# Patient Record
Sex: Female | Born: 1970 | Race: Black or African American | Hispanic: No | Marital: Married | Smoking: Current every day smoker
Health system: Southern US, Community
[De-identification: ages and names within clinical notes are randomized; demographics above are authoritative.]

## PROBLEM LIST (undated history)

## (undated) DIAGNOSIS — K219 Gastro-esophageal reflux disease without esophagitis: Secondary | ICD-10-CM

## (undated) DIAGNOSIS — I10 Essential (primary) hypertension: Secondary | ICD-10-CM

## (undated) DIAGNOSIS — M069 Rheumatoid arthritis, unspecified: Secondary | ICD-10-CM

## (undated) DIAGNOSIS — Z8042 Family history of malignant neoplasm of prostate: Secondary | ICD-10-CM

## (undated) DIAGNOSIS — D649 Anemia, unspecified: Secondary | ICD-10-CM

## (undated) DIAGNOSIS — C801 Malignant (primary) neoplasm, unspecified: Secondary | ICD-10-CM

## (undated) DIAGNOSIS — N189 Chronic kidney disease, unspecified: Secondary | ICD-10-CM

## (undated) DIAGNOSIS — E119 Type 2 diabetes mellitus without complications: Secondary | ICD-10-CM

## (undated) DIAGNOSIS — M329 Systemic lupus erythematosus, unspecified: Secondary | ICD-10-CM

## (undated) DIAGNOSIS — R519 Headache, unspecified: Secondary | ICD-10-CM

## (undated) HISTORY — DX: Family history of malignant neoplasm of prostate: Z80.42

## (undated) HISTORY — PX: CHOLECYSTECTOMY: SHX55

## (undated) HISTORY — PX: TUBAL LIGATION: SHX77

---

## 2008-11-11 HISTORY — PX: FRACTURE SURGERY: SHX138

## 2019-06-21 ENCOUNTER — Ambulatory Visit: Payer: Self-pay | Admitting: Surgery

## 2019-06-21 NOTE — H&P (View-Only) (Signed)
History of Present Illness Sandra Patterson. Sandra Patterson; 06/21/2019 12:27 PM) The patient is a 48 year old female who presents with breast cancer. Referred by Sandra Patterson for right breast cancer  This is a 48 year old female who is perimenopausal who presents from [place] for new diagnosis of invasive lobular carcinoma of the right breast. The patient has been very good about keeping up with her annual mammograms. She had an abnormal finding in the retroareolar region on the right. She underwent further workup including ultrasound and core biopsy. Ultrasound showed an 8 mm hypoechoic area 9 o'clock position 1 cm from the nipple. Biopsy showed invasive lobular carcinoma. Ki-67 is 30%. ER and PR both positive. HER-2 is negative. The patient wishes to have her surgical and oncologic care performed in [place].  Menarche age 53 First pregnancy age 3 Breast-feed no Her months she was on oral contraceptives for at least 10 years Exline last menstrual. The patient is having abnormal irregular periods about every other month Family history negative for breast cancer   Problem List/Past Medical Sandra Patterson. Tiler Brandis, Patterson; 06/21/2019 12:27 PM) INVASIVE LOBULAR CARCINOMA OF BREAST IN FEMALE (C50.919)  Past Surgical History (Sandra Patterson, [employer]; 06/21/2019 11:30 AM) Gallbladder Surgery - Laparoscopic  Diagnostic Studies History (Sandra Patterson, [place]; 06/21/2019 11:30 AM) Colonoscopy 1-5 years ago Mammogram within last year  Allergies (Sandra Patterson, [place]; 06/21/2019 11:31 AM) Codeine Phosphate *ANALGESICS - OPIOID* Allergies Reconciled  Medication History (Sandra Patterson, [place]; 06/21/2019 11:32 AM) Atorvastatin Calcium (40MG Tablet, Oral) Active. Benazepril HCl (40MG Tablet, Oral) Active. Carvedilol (25MG Tablet, Oral) Active. Famotidine (20MG Tablet, Oral) Active. Vitamin D3 (1.25 MG(50000 UT) Capsule, Oral) Active. Hydroxychloroquine Sulfate (200MG Tablet, Oral)  Active. Iron (65MG Tablet, Oral) Active. Medications Reconciled  Social History (Sandra Patterson, [place]; 06/21/2019 11:30 AM) Alcohol use Occasional alcohol use. Caffeine use Carbonated beverages, Coffee. No drug use Tobacco use Current every day smoker.  Family History (Sandra Patterson, [place]; 06/21/2019 11:30 AM) Alcohol Abuse Father. Cancer Father, Mother. Heart disease in female family member before age 65 Hypertension Brother, Sister. Thyroid problems Sister.  Pregnancy / Birth History (Sandra Patterson, [place]; 06/21/2019 11:30 AM) Age at menarche 52 years. Contraceptive History Oral contraceptives. Gravida 3 Irregular periods Maternal age 76-20 Para 2  Other Problems Sandra Patterson. Sandra Harari, Patterson; 06/21/2019 12:27 PM) Arthritis Back Pain Cholelithiasis Gastric Ulcer Gastroesophageal Reflux Disease High blood pressure Hypercholesterolemia     Review of Systems (Sandra A. Brown RMA; 06/21/2019 11:30 AM) General Present- Night Sweats and Weight Gain. Not Present- Appetite Loss, Chills, Fatigue, Fever and Weight Loss. Skin Not Present- Change in Wart/Mole, Dryness, Hives, Jaundice, New Lesions, Non-Healing Wounds, Rash and Ulcer. HEENT Present- Wears glasses/contact lenses. Not Present- Earache, Hearing Loss, Hoarseness, Nose Bleed, Oral Ulcers, Ringing in the Ears, Seasonal Allergies, Sinus Pain, Sore Throat, Visual Disturbances and Yellow Eyes. Breast Present- Skin Changes. Not Present- Breast Mass, Breast Pain and Nipple Discharge.  Vitals (Sandra A. Brown RMA; 06/21/2019 11:31 AM) 06/21/2019 11:30 AM Weight: 297.4 lb Height: 69in Body Surface Area: 2.45 m Body Mass Index: 43.92 kg/m  Temp.: 97.31F  BP: 132/84 (Sitting, Left Arm, Standard)        Physical Exam Sandra Patterson; 06/21/2019 12:27 PM)  The physical exam findings are as follows: Note:WDWN in NAD Eyes: Pupils equal, round; sclera anicteric HENT: Oral mucosa moist;  good dentition Neck: No masses palpated, no thyromegaly Lungs: CTA bilaterally; normal respiratory effort Breasts: symmetric, large pendulous breasts with  bilateral fibrocystic changes; no dominant masses; no axillary lymphadenopathy CV: Regular rate and rhythm; no murmurs; extremities well-perfused with no edema Abd: +bowel sounds, soft, non-tender, no palpable organomegaly; no palpable hernias Skin: Warm, dry; no sign of jaundice Psychiatric - alert and oriented x 4; calm mood and affect    Assessment & Plan Sandra Key K. Sandra Patterson; 06/21/2019 12:29 PM)  INVASIVE LOBULAR CARCINOMA OF BREAST IN FEMALE (C50.919) Impression: Right breast; retroareolar; 8 mm  Current Plans Schedule for Surgery - Right radioactive seed localized lumpectomy/ sentinel lymph node biopsy. The surgical procedure has been discussed with the patient. Potential risks, benefits, alternative treatments, and expected outcomes have been explained. All of the patient's questions at this time have been answered. The likelihood of reaching the patient's treatment goal is good. The patient understand the proposed surgical procedure and wishes to proceed. Referred to Oncology, for evaluation and follow up (Oncology). Routine. Referred to Radiation Oncology, for evaluation and follow up (Radiation Oncology). Routine. Note:I spent approximately 30 minutes of this 40 minute appointment with the patient and her husband along with her sister who listened and by telephone. We discussed her surgical options for treatment including mastectomy versus breast conserving therapy. We will also discuss her possible postoperative course including radiation and hormone therapy. I answered all their questions. The patient has made a decision to proceed with breast conserving therapy. We will schedule a left lumpectomy under radioactive seed localization as well as lymph node biopsy.  Sandra Patterson. Sandra Dover, Patterson, [place] Tra[facility] Surgery Beeper [phone]  06/21/2019 12:29 PM

## 2019-06-21 NOTE — H&P (Signed)
History of Present Illness Sandra Patterson. Ima Hafner MD; 06/21/2019 12:27 PM) The patient is a 48 year old female who presents with breast cancer. Referred by Loma Newton FNP for right breast cancer  This is a 48 year old female who is perimenopausal who presents from [place] for new diagnosis of invasive lobular carcinoma of the right breast. The patient has been very good about keeping up with her annual mammograms. She had an abnormal finding in the retroareolar region on the right. She underwent further workup including ultrasound and core biopsy. Ultrasound showed an 8 mm hypoechoic area 9 o'clock position 1 cm from the nipple. Biopsy showed invasive lobular carcinoma. Ki-67 is 30%. ER and PR both positive. HER-2 is negative. The patient wishes to have her surgical and oncologic care performed in [place].  Menarche age 15 First pregnancy age 79 Breast-feed no Her months she was on oral contraceptives for at least 10 years Exline last menstrual. The patient is having abnormal irregular periods about every other month Family history negative for breast cancer   Problem List/Past Medical Sandra Patterson. Paradise Vensel, MD; 06/21/2019 12:27 PM) INVASIVE LOBULAR CARCINOMA OF BREAST IN FEMALE (C50.919)  Past Surgical History ([name], [place]; 06/21/2019 11:30 AM) Gallbladder Surgery - Laparoscopic  Diagnostic Studies History ([name], [place]; 06/21/2019 11:30 AM) Colonoscopy 1-5 years ago Mammogram within last year  Allergies ([name], [place]; 06/21/2019 11:31 AM) Codeine Phosphate *ANALGESICS - OPIOID* Allergies Reconciled  Medication History ([name], [place]; 06/21/2019 11:32 AM) Atorvastatin Calcium (40MG Tablet, Oral) Active. Benazepril HCl (40MG Tablet, Oral) Active. Carvedilol (25MG Tablet, Oral) Active. Famotidine (20MG Tablet, Oral) Active. Vitamin D3 (1.25 MG(50000 UT) Capsule, Oral) Active. Hydroxychloroquine Sulfate (200MG Tablet, Oral)  Active. Iron (65MG Tablet, Oral) Active. Medications Reconciled  Social History ([name], [place]; 06/21/2019 11:30 AM) Alcohol use Occasional alcohol use. Caffeine use Carbonated beverages, Coffee. No drug use Tobacco use Current every day smoker.  Family History ([name], [place]; 06/21/2019 11:30 AM) Alcohol Abuse Father. Cancer Father, Mother. Heart disease in female family member before age 67 Hypertension Brother, Sister. Thyroid problems Sister.  Pregnancy / Birth History ([name], [place]; 06/21/2019 11:30 AM) Age at menarche 40 years. Contraceptive History Oral contraceptives. Gravida 3 Irregular periods Maternal age 6-20 Para 2  Other Problems Sandra Patterson. Somya Jauregui, MD; 06/21/2019 12:27 PM) Arthritis Back Pain Cholelithiasis Gastric Ulcer Gastroesophageal Reflux Disease High blood pressure Hypercholesterolemia     Review of Systems ([name] RMA; 06/21/2019 11:30 AM) General Present- Night Sweats and Weight Gain. Not Present- Appetite Loss, Chills, Fatigue, Fever and Weight Loss. Skin Not Present- Change in Wart/Mole, Dryness, Hives, Jaundice, New Lesions, Non-Healing Wounds, Rash and Ulcer. HEENT Present- Wears glasses/contact lenses. Not Present- Earache, Hearing Loss, Hoarseness, Nose Bleed, Oral Ulcers, Ringing in the Ears, Seasonal Allergies, Sinus Pain, Sore Throat, Visual Disturbances and Yellow Eyes. Breast Present- Skin Changes. Not Present- Breast Mass, Breast Pain and Nipple Discharge.  Vitals ([name] RMA; 06/21/2019 11:31 AM) 06/21/2019 11:30 AM Weight: 297.4 lb Height: 69in Body Surface Area: 2.45 m Body Mass Index: 43.92 kg/m  Temp.: 97.4F  BP: 132/84 (Sitting, Left Arm, Standard)        Physical Exam Rodman Key K. Orva Riles MD; 06/21/2019 12:27 PM)  The physical exam findings are as follows: Note:WDWN in NAD Eyes: Pupils equal, round; sclera anicteric HENT: Oral mucosa moist;  good dentition Neck: No masses palpated, no thyromegaly Lungs: CTA bilaterally; normal respiratory effort Breasts: symmetric, large pendulous breasts with  bilateral fibrocystic changes; no dominant masses; no axillary lymphadenopathy CV: Regular rate and rhythm; no murmurs; extremities well-perfused with no edema Abd: +bowel sounds, soft, non-tender, no palpable organomegaly; no palpable hernias Skin: Warm, dry; no sign of jaundice Psychiatric - alert and oriented x 4; calm mood and affect    Assessment & Plan Rodman Key K. Rosemond Lyttle MD; 06/21/2019 12:29 PM)  INVASIVE LOBULAR CARCINOMA OF BREAST IN FEMALE (C50.919) Impression: Right breast; retroareolar; 8 mm  Current Plans Schedule for Surgery - Right radioactive seed localized lumpectomy/ sentinel lymph node biopsy. The surgical procedure has been discussed with the patient. Potential risks, benefits, alternative treatments, and expected outcomes have been explained. All of the patient's questions at this time have been answered. The likelihood of reaching the patient's treatment goal is good. The patient understand the proposed surgical procedure and wishes to proceed. Referred to Oncology, for evaluation and follow up (Oncology). Routine. Referred to Radiation Oncology, for evaluation and follow up (Radiation Oncology). Routine. Note:I spent approximately 30 minutes of this 40 minute appointment with the patient and her husband along with her sister who listened and by telephone. We discussed her surgical options for treatment including mastectomy versus breast conserving therapy. We will also discuss her possible postoperative course including radiation and hormone therapy. I answered all their questions. The patient has made a decision to proceed with breast conserving therapy. We will schedule a left lumpectomy under radioactive seed localization as well as lymph node biopsy.  Sandra Patterson. [name], MD, [place] Trauma Surgery Beeper [phone]  06/21/2019 12:29 PM

## 2019-06-22 NOTE — Progress Notes (Signed)
Location of Breast Cancer: Primary malignant neoplasm of right breast- Invasive lobular carcinoma right breast  Did patient present with symptoms (if so, please note symptoms) or was this found on screening mammography?: Routine Mammogram  Mammogram: abnormal finding in the retro-areolar region on the right.  Ultrasound: 8 mm hypoechoic area 9 o'clock position, 1 cm from the nipple.  Histology per Pathology Report: Right Breast 06/08/2019  Receptor Status: ER(+), PR (+), Her2-neu (-), Ki-67(30%)  Past/Anticipated interventions by surgeon, if any: Dr. [name] 06/21/2019 -Right radioactive seed localized lumpectomy -Referred to Oncology, for evaluation and follow up (Oncology). -Referred to Radiation Oncology, for evaluation and follow up (Radiation Oncology).   Past/Anticipated interventions by medical oncology, if any: Chemotherapy   Lymphedema issues, if any: No  Pain issues, if any:  No  SAFETY ISSUES:  Prior radiation? No  Pacemaker/ICD? No  Possible current pregnancy? Perimenopausal  Is the patient on methotrexate? No   Current Complaints / other details:   -Has constant pain in her ankle and stomach.     Cori Razor, RN 06/22/2019,3:56 PM

## 2019-06-23 ENCOUNTER — Ambulatory Visit
Admission: RE | Admit: 2019-06-23 | Discharge: 2019-06-23 | Disposition: A | Discharge status: Home / Self Care | Source: Ambulatory Visit | Attending: Radiation Oncology | Admitting: Radiation Oncology

## 2019-06-23 ENCOUNTER — Encounter: Payer: Self-pay | Admitting: Radiation Oncology

## 2019-06-23 ENCOUNTER — Ambulatory Visit: Payer: Self-pay | Admitting: Surgery

## 2019-06-23 ENCOUNTER — Other Ambulatory Visit: Payer: Self-pay

## 2019-06-23 ENCOUNTER — Encounter: Payer: Self-pay | Admitting: *Deleted

## 2019-06-23 ENCOUNTER — Other Ambulatory Visit: Payer: Self-pay | Admitting: Surgery

## 2019-06-23 VITALS — Ht 69.0 in | Wt 296.0 lb

## 2019-06-23 DIAGNOSIS — C50911 Malignant neoplasm of unspecified site of right female breast: Secondary | ICD-10-CM

## 2019-06-23 DIAGNOSIS — C50411 Malignant neoplasm of upper-outer quadrant of right female breast: Secondary | ICD-10-CM | POA: Insufficient documentation

## 2019-06-23 HISTORY — DX: Rheumatoid arthritis, unspecified: M06.9

## 2019-06-23 HISTORY — DX: Systemic lupus erythematosus, unspecified: M32.9

## 2019-06-23 NOTE — Progress Notes (Signed)
Radiation Oncology         [phone] ________________________________  Initial Outpatient Consultation - Conducted via telephone due to current COVID-19 concerns for limiting patient exposure  I spoke with the patient to conduct this consult visit via telephone to spare the patient unnecessary potential exposure in the healthcare setting during the current COVID-19 pandemic. The patient was notified in advance and was offered a [place] meeting to allow for face to face communication but unfortunately reported that they did not have the appropriate resources/technology to support such a visit and instead preferred to proceed with a telephone consult.  ________________________________  Name: Sandra Patterson        MRN: [id number]  Date of Service: 06/23/2019 DOB: 1971/07/26  CC:No primary care provider on file.  [name], MD     REFERRING PHYSICIAN: [name], MD   DIAGNOSIS: The encounter diagnosis was Primary malignant neoplasm of right breast [facility]).   HISTORY OF PRESENT ILLNESS: Sandra Patterson is a 48 y.o. female seen  for a new diagnosis of right breast cancer. The patient was noted to have a screening detected right breast mass when she went for routine mammography in [place], [place] where she resides. Diagnostic imaging is not available for review but per Dr. [name], she had an 8 mm lesion in the right breast at 9:00 and her axilla was clinically negative. 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 She met with Dr. [name] and wishes to proceed with treatment in [place]. She is in the process of getting set up for right lumpectomy with sentinel node biopsy and comes today to discuss the rationale for adjuvant radiotherapy.    PREVIOUS RADIATION THERAPY: No   PAST MEDICAL HISTORY:  Past Medical History:  Diagnosis Date   Lupus (systemic lupus erythematosus) ([place])    Rheumatoid  arthritis ([place])        PAST SURGICAL HISTORY:History reviewed. No pertinent surgical history.   FAMILY HISTORY:  Family History  Problem Relation Age of Onset   Lupus Mother    Throat cancer Father    Lupus Sister      SOCIAL HISTORY:  reports that she has been smoking. She has never used smokeless tobacco. She reports that she does not drink alcohol or use drugs. The patient is unemployed as a result of layoffs from [employer]. She has three adult children and lives in [place], [place].    ALLERGIES: Codeine   MEDICATIONS:  Current Outpatient Medications  Medication Sig Dispense Refill   atorvastatin (LIPITOR) 40 MG tablet Take 40 mg by mouth daily.     benazepril (LOTENSIN) 40 MG tablet Take 40 mg by mouth daily.     carvedilol (COREG) 25 MG tablet Take 25 mg by mouth 2 (two) times daily with a meal.     famotidine (PEPCID) 20 MG tablet Take 20 mg by mouth 2 (two) times daily.     ferrous sulfate 324 MG TBEC Take 324 mg by mouth.     hydroxychloroquine (PLAQUENIL) 200 MG tablet Take by mouth daily.     Vitamin D, Ergocalciferol, (DRISDOL) 1.25 MG (50000 UT) CAPS capsule Take 50,000 Units by mouth every 7 (seven) days.     No current facility-administered medications for this encounter.      REVIEW OF SYSTEMS: On review of systems, the patient reports that she is doing well overall. She denies any chest pain, shortness of breath,  cough, fevers, chills, night sweats, unintended weight changes. She denies any bowel or bladder disturbances, and denies abdominal pain, nausea or vomiting. She denies any new musculoskeletal or joint aches or pains. A complete review of systems is obtained and is otherwise negative.     PHYSICAL EXAM:  Wt Readings from Last 3 Encounters:  06/23/19 296 lb (134.3 kg)   Unable to assess due to nature of encounter   ECOG = 0  0 - Asymptomatic (Fully active, able to carry on all predisease activities without restriction)  1 - Symptomatic but  completely ambulatory (Restricted in physically strenuous activity but ambulatory and able to carry out work of a light or sedentary nature. For example, light housework, office work)  2 - Symptomatic, <50% in bed during the day (Ambulatory and capable of all self care but unable to carry out any work activities. Up and about more than 50% of waking hours)  3 - Symptomatic, >50% in bed, but not bedbound (Capable of only limited self-care, confined to bed or chair 50% or more of waking hours)  4 - Bedbound (Completely disabled. Cannot carry on any self-care. Totally confined to bed or chair)  5 - Death   Eustace Pen MM, Creech RH, Tormey DC, et al. [phone]). "Toxicity and response criteria of the [facility]". [place] Oncol. 5 (6): 649-55    LABORATORY DATA:  No results found for: WBC, HGB, HCT, MCV, PLT No results found for: NA, K, CL, CO2 No results found for: ALT, AST, GGT, ALKPHOS, BILITOT    RADIOGRAPHY: No results found.     IMPRESSION/PLAN: 1. Probable Stage IA, [id number] grade x, ER/PR positive invasive lobular carcinoma of the right breast. Dr. Lisbeth Renshaw discusses the pathology findings and reviews the nature of right breast disease. The consensus from the breast conference includes breast conservation with lumpectomy with  sentinel node biopsy. Depending on the size of the final tumor measurements rendered by pathology, the tumor may be tested for Oncotype Dx score to determine a role for systemic therapy. Provided that chemotherapy is not indicated, the patient's course would then be followed by external radiotherapy to the breast followed by antiestrogen therapy. We discussed the risks, benefits, short, and long term effects of radiotherapy, and the patient is interested in proceeding. Dr. Lisbeth Renshaw discusses the delivery and logistics of radiotherapy and anticipates a course of 6 1/2 weeks of radiotherapy. We will see her back about 2 weeks after surgery to discuss the  simulation process and anticipate we starting radiotherapy about 4-6 weeks after surgery.  2. Possible genetic predisposition to malignancy. The patient is a candidate for genetic testing given her personal and family history. She was offered referral and is interested but states results of this would not influence her surgical decision making. 3. Rheumatologic conditions. The patient has mild lupus per report and takes Plaquenil for RA. She continues to follow with Dr. Scarlette Shorts in [place]. We will follow her closely along during treatment and she is aware that these conditions can flare in the midst of radiotherapy but would not be prohibitive to consider radiotherapy in her situation.   Given current concerns for patient exposure during the COVID-19 pandemic, this encounter was conducted via telephone.  The patient has given verbal consent for this type of encounter. The time spent during this encounter was 45 minutes and 50% of that time was spent in the coordination of her care. The attendants for this meeting included Dr. Lisbeth Renshaw, Blenda Nicely, RN, Bryson Ha  Dara Lords, PAC and Jordan Likes  During the encounter, Dr. Lisbeth Renshaw, Blenda Nicely, RN, and Shona Simpson [facility] were located at [facility] Radiation Oncology Department.  Sandra Patterson  was located at home.   The above documentation reflects my direct findings during this shared patient visit. Please see the separate note by Dr. Lisbeth Renshaw on this date for the remainder of the patient's plan of care.    Carola Rhine, PAC

## 2019-06-24 ENCOUNTER — Telehealth: Payer: Self-pay | Admitting: Hematology and Oncology

## 2019-06-24 ENCOUNTER — Other Ambulatory Visit: Payer: Self-pay | Admitting: Surgery

## 2019-06-24 DIAGNOSIS — C50911 Malignant neoplasm of unspecified site of right female breast: Secondary | ICD-10-CM

## 2019-06-24 NOTE — Telephone Encounter (Signed)
Received a new patient referral from Dr. [name] for a dx of breast cancer. Ms. Sandra Patterson has been cld and scheduled to see Dr. Lindi Adie on 8/14 at 1pm. She's been made aware to arrive 20 minutes early.

## 2019-06-25 ENCOUNTER — Inpatient Hospital Stay: Payer: Self-pay | Admitting: Hematology and Oncology

## 2019-06-25 ENCOUNTER — Encounter: Payer: Self-pay | Admitting: *Deleted

## 2019-06-25 ENCOUNTER — Other Ambulatory Visit: Payer: Self-pay

## 2019-06-25 ENCOUNTER — Inpatient Hospital Stay: Attending: Oncology | Admitting: Hematology and Oncology

## 2019-06-25 DIAGNOSIS — Z170 Estrogen receptor positive status [ER+]: Secondary | ICD-10-CM | POA: Insufficient documentation

## 2019-06-25 DIAGNOSIS — E785 Hyperlipidemia, unspecified: Secondary | ICD-10-CM | POA: Insufficient documentation

## 2019-06-25 DIAGNOSIS — F17210 Nicotine dependence, cigarettes, uncomplicated: Secondary | ICD-10-CM | POA: Diagnosis not present

## 2019-06-25 DIAGNOSIS — M069 Rheumatoid arthritis, unspecified: Secondary | ICD-10-CM | POA: Insufficient documentation

## 2019-06-25 DIAGNOSIS — K219 Gastro-esophageal reflux disease without esophagitis: Secondary | ICD-10-CM | POA: Insufficient documentation

## 2019-06-25 DIAGNOSIS — C50911 Malignant neoplasm of unspecified site of right female breast: Secondary | ICD-10-CM

## 2019-06-25 DIAGNOSIS — Z79899 Other long term (current) drug therapy: Secondary | ICD-10-CM | POA: Insufficient documentation

## 2019-06-25 DIAGNOSIS — M329 Systemic lupus erythematosus, unspecified: Secondary | ICD-10-CM | POA: Insufficient documentation

## 2019-06-25 DIAGNOSIS — C50411 Malignant neoplasm of upper-outer quadrant of right female breast: Secondary | ICD-10-CM | POA: Diagnosis present

## 2019-06-25 DIAGNOSIS — I10 Essential (primary) hypertension: Secondary | ICD-10-CM | POA: Insufficient documentation

## 2019-06-25 NOTE — Progress Notes (Signed)
She she had her [name] NOTE  Patient Care Team: System, Pcp Not In as PCP - General  CHIEF COMPLAINTS/PURPOSE OF CONSULTATION:  Newly diagnosed breast cancer  HISTORY OF PRESENTING ILLNESS:  Sandra Patterson 48 y.o. female is here because of recent diagnosis of right breast cancer.  Patient had a routine screening mammogram that detected a 0.8 cm mass in the right breast position 1 cm from the nipple.  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  This was detected at [facility] in [place].  She was seen by surgery and was recommended to see medical oncology for multidisciplinary care.  I reviewed her records extensively and collaborated the history with the patient.  SUMMARY OF ONCOLOGIC HISTORY: Oncology History  Primary malignant neoplasm of right breast ([place])  06/23/2019 Initial Diagnosis   Routine screening mammogram detected an 0.8cm mass in the right breast at the 9:00 position 1 cm from the nipple. 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    06/23/2019 Cancer Staging   Staging form: Breast, AJCC 8th Edition - Clinical stage from 06/23/2019: cT1b, cN0, cM0, GX, ER+, PR+, HER2- - Signed by [name], MD on 06/25/2019      MEDICAL HISTORY:  Past Medical History:  Diagnosis Date  . Lupus (systemic lupus erythematosus) ([place])   . Rheumatoid arthritis ([place])     SURGICAL HISTORY: No past surgical history on file.  SOCIAL HISTORY: Social History   Socioeconomic History  . Marital status: Married    Spouse name: Not on file  . Number of children: Not on file  . Years of education: Not on file  . Highest education level: Not on file  Occupational History  . Not on file  Social Needs  . Financial resource strain: Not on file  . Food insecurity    Worry: Not on file    Inability: Not on file  . Transportation needs    Medical: No     Non-medical: No  Tobacco Use  . Smoking status: Current Every Day Smoker  . Smokeless tobacco: Never Used  Substance and Sexual Activity  . Alcohol use: Never    Frequency: Never  . Drug use: Never  . Sexual activity: Yes    Birth control/protection: None  Lifestyle  . Physical activity    Days per week: Not on file    Minutes per session: Not on file  . Stress: Not on file  Relationships  . Social Herbalist on phone: Not on file    Gets together: Not on file    Attends religious service: Not on file    Active member of club or organization: Not on file    Attends meetings of clubs or organizations: Not on file    Relationship status: Not on file  . Intimate partner violence    Fear of current or ex partner: Not on file    Emotionally abused: Not on file    Physically abused: Not on file    Forced sexual activity: Not on file  Other Topics Concern  . Not on file  Social History Narrative  . Not on file    FAMILY HISTORY: Family History  Problem Relation Age of Onset  . Lupus Mother   . Throat cancer Father   . Lupus Sister     ALLERGIES:  is allergic to codeine.  MEDICATIONS:  Current Outpatient Medications  Medication Sig Dispense Refill  . amLODipine (NORVASC) 5 MG tablet Take 5 mg by mouth daily.    Marland Kitchen atorvastatin (LIPITOR) 40 MG tablet Take 40 mg by mouth daily.    . benazepril (LOTENSIN) 40 MG tablet Take 40 mg by mouth 2 (two) times daily.     . carvedilol (COREG) 25 MG tablet Take 25 mg by mouth 2 (two) times daily with a meal.    . famotidine (PEPCID) 20 MG tablet Take 20 mg by mouth daily.     . ferrous sulfate 325 (65 FE) MG tablet Take 325 mg by mouth 2 (two) times daily.     . hydroxychloroquine (PLAQUENIL) 200 MG tablet Take 200 mg by mouth 2 (two) times daily.     . Vitamin D, Ergocalciferol, (DRISDOL) 1.25 MG (50000 UT) CAPS capsule Take 50,000 Units by mouth every Friday.      No current facility-administered medications for this  visit.     REVIEW OF SYSTEMS:   Constitutional: Denies fevers, chills or abnormal night sweats Eyes: Denies blurriness of vision, double vision or watery eyes Ears, nose, mouth, throat, and face: Denies mucositis or sore throat Respiratory: Denies cough, dyspnea or wheezes Cardiovascular: Denies palpitation, chest discomfort or lower extremity swelling Gastrointestinal:  Denies nausea, heartburn or change in bowel habits Skin: Denies abnormal skin rashes Lymphatics: Denies new lymphadenopathy or easy bruising Neurological:Denies numbness, tingling or new weaknesses Behavioral/Psych: Mood is stable, no new changes  Breast:  Denies any palpable lumps or discharge All other systems were reviewed with the patient and are negative.  PHYSICAL EXAMINATION: ECOG PERFORMANCE STATUS: 1 - Symptomatic but completely ambulatory  Vitals:   06/25/19 1328  BP: (!) 160/92  Pulse: 79  Resp: 18  Temp: 99.6 F (37.6 C)  SpO2: 97%   Filed Weights   06/25/19 1328  Weight: 294 lb 14.4 oz (133.8 kg)    GENERAL:alert, no distress and comfortable SKIN: skin color, texture, turgor are normal, no rashes or significant lesions EYES: normal, conjunctiva are pink and non-injected, sclera clear OROPHARYNX:no exudate, no erythema and lips, buccal mucosa, and tongue normal  NECK: supple, thyroid normal size, non-tender, without nodularity LYMPH:  no palpable lymphadenopathy in the cervical, axillary or inguinal LUNGS: clear to auscultation and percussion with normal breathing effort HEART: regular rate & rhythm and no murmurs and no lower extremity edema ABDOMEN:abdomen soft, non-tender and normal bowel sounds Musculoskeletal:no cyanosis of digits and no clubbing  PSYCH: alert & oriented x 3 with fluent speech NEURO: no focal motor/sensory deficits BREAST: No palpable nodules in breast. No palpable axillary or supraclavicular lymphadenopathy (exam performed in the presence of a chaperone)    RADIOGRAPHIC STUDIES: I have personally reviewed the radiological reports and agreed with the findings in the report.  ASSESSMENT AND PLAN:  Primary malignant neoplasm of right breast ([place]) 06/23/2019:Routine screening mammogram detected an 0.8cm mass in the right breast at the 9:00 position 1 cm from the nipple. 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  T1CN0 stage Ia clinical stage History of rheumatoid arthritis and lupus and hypertension  Pathology and radiology counseling:Discussed with the patient, the details of pathology including the type of breast cancer,the clinical staging, the significance of ER, PR and HER-2/neu receptors and the implications for treatment. After reviewing the pathology in detail, we proceeded to discuss the different treatment options between surgery, radiation, chemotherapy, antiestrogen therapies.  Recommendations: 1. Breast conserving surgery followed by 2.  Oncotype DX testing to determine if chemotherapy would be of any benefit followed by 3. Adjuvant radiation therapy followed by 4. Adjuvant antiestrogen therapy  Oncotype counseling: I discussed Oncotype DX test. I explained to the patient that this is a 21 gene panel to evaluate patient tumors DNA to calculate recurrence score. This would help determine whether patient has high risk or intermediate risk or low risk breast cancer. She understands that if her tumor was found to be high risk, she would benefit from systemic chemotherapy. If low risk, no need of chemotherapy. If she was found to be intermediate risk, we would need to evaluate the score as well as other risk factors and determine if an abbreviated chemotherapy may be of benefit.  Return to clinic after surgery to discuss final pathology report and then determine if Oncotype DX testing will need to be sent.     All questions were answered. The patient knows to call the clinic with any problems, questions  or concerns.    Harriette Ohara, MD 06/25/19

## 2019-06-25 NOTE — Assessment & Plan Note (Signed)
06/23/2019:Routine screening mammogram detected an 0.8cm mass in the right breast at the 9:00 position 1 cm from the nipple. 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  T1CN0 stage Ia clinical stage  Pathology and radiology counseling:Discussed with the patient, the details of pathology including the type of breast cancer,the clinical staging, the significance of ER, PR and HER-2/neu receptors and the implications for treatment. After reviewing the pathology in detail, we proceeded to discuss the different treatment options between surgery, radiation, chemotherapy, antiestrogen therapies.  Recommendations: 1. Breast conserving surgery followed by 2. Oncotype DX testing to determine if chemotherapy would be of any benefit followed by 3. Adjuvant radiation therapy followed by 4. Adjuvant antiestrogen therapy  Oncotype counseling: I discussed Oncotype DX test. I explained to the patient that this is a 21 gene panel to evaluate patient tumors DNA to calculate recurrence score. This would help determine whether patient has high risk or intermediate risk or low risk breast cancer. She understands that if her tumor was found to be high risk, she would benefit from systemic chemotherapy. If low risk, no need of chemotherapy. If she was found to be intermediate risk, we would need to evaluate the score as well as other risk factors and determine if an abbreviated chemotherapy may be of benefit.  Return to clinic after surgery to discuss final pathology report and then determine if Oncotype DX testing will need to be sent.

## 2019-06-28 ENCOUNTER — Other Ambulatory Visit: Payer: Self-pay | Admitting: Surgery

## 2019-06-28 ENCOUNTER — Telehealth: Payer: Self-pay | Admitting: Hematology and Oncology

## 2019-06-28 NOTE — Telephone Encounter (Signed)
I left a message regarding schedule  

## 2019-06-28 NOTE — Pre-Procedure Instructions (Signed)
Sandra Patterson  06/28/2019      [facility] [address], [place] [address] Phone: [phone] Fax: [phone]    Your procedure is scheduled on July 02, 2019.  Report to [facility] Admitting at 530 AM.  Call this number if you have problems the morning of surgery:  [phone]   Remember:  Do not eat after midnight.  You may drink clear liquids until 430 AM.  Clear liquids allowed are:                    Water, Juice (non-citric and without pulp), Clear Tea, Black Coffee only and Gatorade    Take these medicines the morning of surgery with A SIP OF WATER  Amlodipine (Norvasc) Carvedilol (Coreg) Famotidine (Pepcid)  Beginning now, STOP taking any Aspirin (unless otherwise instructed by your surgeon), Aleve, Naproxen, Ibuprofen, Motrin, Advil, Goody's, BC's, all herbal medications, fish oil, and all vitamins    Day of surgery:  Do not wear jewelry, make-up or nail polish.  Do not wear lotions, powders, or perfumes, or deodorant.  Do not shave 48 hours prior to surgery.    Do not bring valuables to the hospital.  [facility] is not responsible for any belongings or valuables.  Contacts, dentures or bridgework may not be worn into surgery.  Leave your suitcase in the car.  After surgery it may be brought to your room.  For patients admitted to the hospital, discharge time will be determined by your treatment team.  Patients discharged the day of surgery will not be allowed to drive home.    [facility]- Preparing For Surgery  Before surgery, you can play an important role. Because skin is not sterile, your skin needs to be as free of germs as possible. You can reduce the number of germs on your skin by washing with CHG (chlorahexidine gluconate) Soap before surgery.  CHG is an antiseptic cleaner which kills germs and bonds with the skin to continue killing germs even after washing.    Oral Hygiene is  also important to reduce your risk of infection.  Remember - BRUSH YOUR TEETH THE MORNING OF SURGERY WITH YOUR REGULAR TOOTHPASTE  Please do not use if you have an allergy to CHG or antibacterial soaps. If your skin becomes reddened/irritated stop using the CHG.  Do not shave (including legs and underarms) for at least 48 hours prior to first CHG shower. It is OK to shave your face.  Please follow these instructions carefully.   1. Shower the NIGHT BEFORE SURGERY and the MORNING OF SURGERY with CHG.   2. If you chose to wash your hair, wash your hair first as usual with your normal shampoo.  3. After you shampoo, rinse your hair and body thoroughly to remove the shampoo.  4. Use CHG as you would any other liquid soap. You can apply CHG directly to the skin and wash gently with a scrungie or a clean washcloth.   5. Apply the CHG Soap to your body ONLY FROM THE NECK DOWN.  Do not use on open wounds or open sores. Avoid contact with your eyes, ears, mouth and genitals (private parts). Wash Face and genitals (private parts)  with your normal soap.  6. Wash thoroughly, paying special attention to the area where your surgery will be performed.  7. Thoroughly rinse your body with warm water from the neck down.  8. DO NOT shower/wash with your normal soap after using and rinsing off the CHG Soap.  9. Pat yourself dry with a CLEAN TOWEL.  10. Wear CLEAN PAJAMAS to bed the night before surgery, wear comfortable clothes the morning of surgery  11. Place CLEAN SHEETS on your bed the night of your first shower and DO NOT SLEEP WITH PETS.  Day of Surgery: Shower as above Do not apply any deodorants/lotions.  Please wear clean clothes to the hospital/surgery center.   Remember to brush your teeth WITH YOUR REGULAR TOOTHPASTE.   Please read over the following fact sheets that you were given.

## 2019-06-29 ENCOUNTER — Other Ambulatory Visit (HOSPITAL_COMMUNITY)
Admission: RE | Admit: 2019-06-29 | Discharge: 2019-06-29 | Disposition: A | Discharge status: Home / Self Care | Source: Ambulatory Visit | Attending: Surgery | Admitting: Surgery

## 2019-06-29 ENCOUNTER — Encounter (HOSPITAL_COMMUNITY)
Admission: RE | Admit: 2019-06-29 | Discharge: 2019-06-29 | Disposition: A | Discharge status: Home / Self Care | Source: Ambulatory Visit | Attending: Surgery | Admitting: Surgery

## 2019-06-29 ENCOUNTER — Other Ambulatory Visit (HOSPITAL_COMMUNITY): Payer: Self-pay

## 2019-06-29 ENCOUNTER — Encounter (HOSPITAL_COMMUNITY): Payer: Self-pay

## 2019-06-29 ENCOUNTER — Other Ambulatory Visit: Payer: Self-pay

## 2019-06-29 ENCOUNTER — Telehealth: Payer: Self-pay | Admitting: Hematology and Oncology

## 2019-06-29 DIAGNOSIS — Z9049 Acquired absence of other specified parts of digestive tract: Secondary | ICD-10-CM | POA: Insufficient documentation

## 2019-06-29 DIAGNOSIS — M329 Systemic lupus erythematosus, unspecified: Secondary | ICD-10-CM | POA: Diagnosis not present

## 2019-06-29 DIAGNOSIS — Z6841 Body Mass Index (BMI) 40.0 and over, adult: Secondary | ICD-10-CM | POA: Diagnosis not present

## 2019-06-29 DIAGNOSIS — C7981 Secondary malignant neoplasm of breast: Secondary | ICD-10-CM | POA: Insufficient documentation

## 2019-06-29 DIAGNOSIS — R9431 Abnormal electrocardiogram [ECG] [EKG]: Secondary | ICD-10-CM | POA: Insufficient documentation

## 2019-06-29 DIAGNOSIS — M069 Rheumatoid arthritis, unspecified: Secondary | ICD-10-CM | POA: Insufficient documentation

## 2019-06-29 DIAGNOSIS — N189 Chronic kidney disease, unspecified: Secondary | ICD-10-CM | POA: Diagnosis not present

## 2019-06-29 DIAGNOSIS — Z79899 Other long term (current) drug therapy: Secondary | ICD-10-CM | POA: Diagnosis not present

## 2019-06-29 DIAGNOSIS — I129 Hypertensive chronic kidney disease with stage 1 through stage 4 chronic kidney disease, or unspecified chronic kidney disease: Secondary | ICD-10-CM | POA: Insufficient documentation

## 2019-06-29 DIAGNOSIS — Z87891 Personal history of nicotine dependence: Secondary | ICD-10-CM | POA: Insufficient documentation

## 2019-06-29 DIAGNOSIS — D649 Anemia, unspecified: Secondary | ICD-10-CM | POA: Insufficient documentation

## 2019-06-29 DIAGNOSIS — Z01818 Encounter for other preprocedural examination: Secondary | ICD-10-CM | POA: Insufficient documentation

## 2019-06-29 DIAGNOSIS — Z20828 Contact with and (suspected) exposure to other viral communicable diseases: Secondary | ICD-10-CM | POA: Diagnosis not present

## 2019-06-29 DIAGNOSIS — E669 Obesity, unspecified: Secondary | ICD-10-CM | POA: Insufficient documentation

## 2019-06-29 DIAGNOSIS — C50911 Malignant neoplasm of unspecified site of right female breast: Secondary | ICD-10-CM | POA: Insufficient documentation

## 2019-06-29 HISTORY — DX: Anemia, unspecified: D64.9

## 2019-06-29 HISTORY — DX: Type 2 diabetes mellitus without complications: E11.9

## 2019-06-29 HISTORY — DX: Gastro-esophageal reflux disease without esophagitis: K21.9

## 2019-06-29 HISTORY — DX: Chronic kidney disease, unspecified: N18.9

## 2019-06-29 HISTORY — DX: Malignant (primary) neoplasm, unspecified: C80.1

## 2019-06-29 HISTORY — DX: Headache, unspecified: R51.9

## 2019-06-29 HISTORY — DX: Essential (primary) hypertension: I10

## 2019-06-29 LAB — CBC
HCT: 47.5 % — ABNORMAL HIGH (ref 36.0–46.0)
Hemoglobin: 15.5 g/dL — ABNORMAL HIGH (ref 12.0–15.0)
MCH: 31.2 pg (ref 26.0–34.0)
MCHC: 32.6 g/dL (ref 30.0–36.0)
MCV: 95.6 fL (ref 80.0–100.0)
Platelets: 175 10*3/uL (ref 150–400)
RBC: 4.97 MIL/uL (ref 3.87–5.11)
RDW: 12.9 % (ref 11.5–15.5)
WBC: 7.2 10*3/uL (ref 4.0–10.5)
nRBC: 0 % (ref 0.0–0.2)

## 2019-06-29 LAB — BASIC METABOLIC PANEL
Anion gap: 10 (ref 5–15)
BUN: 9 mg/dL (ref 6–20)
CO2: 24 mmol/L (ref 22–32)
Calcium: 9.8 mg/dL (ref 8.9–10.3)
Chloride: 101 mmol/L (ref 98–111)
Creatinine, Ser: 1.53 mg/dL — ABNORMAL HIGH (ref 0.44–1.00)
GFR calc Af Amer: 47 mL/min — ABNORMAL LOW (ref >60)
GFR calc non Af Amer: 40 mL/min — ABNORMAL LOW (ref >60)
Glucose, Bld: 124 mg/dL — ABNORMAL HIGH (ref 70–99)
Potassium: 3.4 mmol/L — ABNORMAL LOW (ref 3.5–5.1)
Sodium: 135 mmol/L (ref 135–145)

## 2019-06-29 LAB — SARS CORONAVIRUS 2 (TAT 6-24 HRS): SARS Coronavirus 2: NEGATIVE

## 2019-06-29 LAB — POCT PREGNANCY, URINE: Preg Test, Ur: NEGATIVE

## 2019-06-29 NOTE — Telephone Encounter (Signed)
Scheduled appt per 8/18 sch message - unable tot reach pt . Left message with appt date and time

## 2019-06-29 NOTE — Progress Notes (Signed)
Call to Dr. Lona Millard office, spoke with Baxter Hire, & she reports pt. Didn't come in for her appt. Today. The pt. told Dr. Lona Millard office that she was stuck in [place] for the day & couldn't make the appt. Appt. Has been rescheduled for Sept. 6.

## 2019-06-29 NOTE — Progress Notes (Addendum)
Anesthesia Chart Review:   Case: [id number] Date/Time: 07/02/19 0715   Procedure: RIGHT BREAST LUMPECTOMY WITH RADIOACTIVE SEED AND SENTINEL LYMPH NODE BIOPSY (Right Breast)   Anesthesia type: General   Pre-op diagnosis: RIGHT INVASIVCE LOBULAR CARCINOMA   Location: [facility] OR   Surgeon: [name], MD    RSL is scheduled for 07/01/19 at 2:15 PM  DISCUSSION: Patient is a 48 year old female scheduled for the above procedure.  History includes smoking, SLE, RA, HTN, GERD, anemia, CKD, right breast cancer. BMI is consistent with obesity.   Patient had a PAT RN visit at 8:00 AM 06/29/19. She reported having a cardiology appointment with Raechel Chute, MD with [facility] later that same day, but when records requested, patient had rescheduled the appointment for 07/18/19 because she didn't feel she could make the appointment in time driving from [place]. I called and spoke with patient to inquire about reason for referral, but she was not sure. She denied chest pain, SOB, palpitations, edema, dizziness, syncope. She does not routinely exercise, but can do day-to-day activities and walk up a flight of stairs without CV symptoms. She thinks she could walk up two flights of stairs if needed. She does not believe that she had prior cardiac testing other than EKG. She reports home BP readings 140-150's/80-90's.  Her 06/29/19 EKG showed NSR, possible LAE, inferior infarct (age undetermined), T wave abnormality (consider ischemia). Currently, no comparison tracing. Given that she rescheduled her cardiology appointment and patient unclear about why she was referred, I called and spoke with her PCP Roselie Skinner, [place] and discussed EKG and labs findings. She reported that patient last seen earlier this month. BP was poorly controlled (agent added) and had elevated blood glucose. Given her multiple risk factors, she was referred to cardiology and felt this should occur prior to  surgery. She will have her staff contact patient and cardiology to see if they can reschedule this week. By PCP verbal account, patient's last Cr earlier this month was ~ 1.22 with glucose 120's and SBP ~ 150-170, DBP 80's-102. Another BP agent added, and patient to have BP recheck and A1c later this week.   I notified Kennyth Lose at Dr. Vonna Kotyk office. Timing of surgery and RSL will depend on when patient is able to be worked back in to see cardiologist. Of note, BP 184/86 at PAT, but patient says she did not take her BP meds prior to her appointment due to leaving her house so early.    Urine pregnancy test negative. COVID-19 test in process. Chart will be left for follow-up.   ADDENDUM 07/01/19 9:33 AM:  Patient had BP recheck and A1c at [facility] on 06/29/19. BP 148/70. Reported A1c 6.6% pushing her just into the DM2 category (will add to history). PCP Roselie Skinner, FNP was able to get patient an appointment with cardiologist Raechel Chute, MD on 06/30/19. He is ordering a future echo, but did not feel it had to be done prior to surgery. He felt she was "low cardiac risk for upcoming lumpectomy and radiation therapy. She can proceed with no additional cardiac testing prior to procedures." I notified [facility] triage nursing staff. COVID test negative.     VS: BP (!) 184/86 Comment: pt states that she hasnt taken bp meds this am/notified Beth RN  Pulse 84   Temp (!) 36.3 C   Resp 18   Ht 5\' 9"  (1.753 m)   Wt 133 kg  LMP 05/18/2019 (Approximate)   SpO2 98%   BMI 43.30 kg/m    PROVIDERS: Roselie Skinner, FNP is PCP  PATHS: [place], [place] Phone [phone] Fax [phone]  - Ronnette Hila, MD is rheumatologist  - She has upcoming cardiology visit at [place].  - She has upcoming nephrology visit at [facility] with Orene Desanctis, DNP.  - GI is with [facility].    LABS: Preoperative labs noted. Cr 1.53, known CKD  with upcoming nephrology visit. Per PCP, previous Cr ~1.22. Glucose 124. H/H 15.5/47.5. Urine pregnancy test negative. (all labs ordered are listed, but only abnormal results are displayed)  Labs Reviewed  BASIC METABOLIC PANEL - Abnormal; Notable for the following components:      Result Value   Potassium 3.4 (*)    Glucose, Bld 124 (*)    Creatinine, Ser 1.53 (*)    GFR calc non Af Amer 40 (*)    GFR calc Af Amer 47 (*)    All other components within normal limits  CBC - Abnormal; Notable for the following components:   Hemoglobin 15.5 (*)    HCT 47.5 (*)    All other components within normal limits  POCT PREGNANCY, URINE     EKG: 06/29/19: Normal sinus rhythm Possible Left atrial enlargement Inferior infarct , age undetermined T wave abnormality, consider lateral ischemia Abnormal ECG Confirmed by Ida Rogue [id number]) on 06/29/2019 3:15:49 PM   CV: N/A   Past Medical History:  Diagnosis Date  . Anemia   . Cancer ([place])    Right breast  . GERD (gastroesophageal reflux disease)   . Headache    pt. relates this to stress somewhat   . Hypertension   . Lupus (systemic lupus erythematosus) ([place])   . Rheumatoid arthritis ([facility])    RA, followed by Dr. Scarlette Shorts in [place] for Lupus & RA    Past Surgical History:  Procedure Laterality Date  . CHOLECYSTECTOMY    . FRACTURE SURGERY Left 2010   pin removed from hand   . TUBAL LIGATION    . VAGINAL DELIVERY     x2    MEDICATIONS: . acetaminophen (TYLENOL) 500 MG tablet  . amLODipine (NORVASC) 5 MG tablet  . atorvastatin (LIPITOR) 40 MG tablet  . benazepril (LOTENSIN) 40 MG tablet  . carvedilol (COREG) 25 MG tablet  . famotidine (PEPCID) 20 MG tablet  . ferrous sulfate 325 (65 FE) MG tablet  . hydroxychloroquine (PLAQUENIL) 200 MG tablet  . Vitamin D, Ergocalciferol, (DRISDOL) 1.25 MG (50000 UT) CAPS capsule   No current facility-administered medications for this encounter.     Myra Gianotti, PA-C Surgical  Short Stay/Anesthesiology [facility] Phone [phone] [facility] Phone [phone] 06/29/2019 5:55 PM

## 2019-06-29 NOTE — Progress Notes (Addendum)
Pt. Seeing cardiology today- [facility] in [place] , last visit 5 yrs. Ago, pt. Reports her PCP- with [facility] is asking her to f/u since its been so many yrs.  Pt. Followed by Scarlette Shorts in [place] for Lupus & RA.  Pt. Reports that she has appt. With Kidney specialist on 07/05/2019- states that she has been told that her "Kidney number is low" but when she saw kidney spec. 2 yrs. Ago, she was told that she really " isn't in a stage yet", at that time she was told to start iron tablet . Pt. Denies ever having any advanced cardiac testing- states her last EKG was a year ago or more.   BP elevated today,  remarks that she didn;t take her BP meds this a.m. because she usually takes them later in the a.m.

## 2019-06-30 ENCOUNTER — Encounter: Payer: Self-pay | Admitting: Adult Health

## 2019-06-30 ENCOUNTER — Encounter: Payer: Self-pay | Admitting: *Deleted

## 2019-07-01 ENCOUNTER — Ambulatory Visit
Admission: RE | Admit: 2019-07-01 | Discharge: 2019-07-01 | Disposition: A | Discharge status: Home / Self Care | Source: Ambulatory Visit | Attending: Surgery | Admitting: Surgery

## 2019-07-01 ENCOUNTER — Encounter (HOSPITAL_COMMUNITY): Payer: Self-pay

## 2019-07-01 ENCOUNTER — Other Ambulatory Visit: Payer: Self-pay

## 2019-07-01 DIAGNOSIS — C50911 Malignant neoplasm of unspecified site of right female breast: Secondary | ICD-10-CM

## 2019-07-01 MED ORDER — DEXTROSE 5 % IV SOLN
3.0000 g | INTRAVENOUS | Status: AC
  Administered 2019-07-02: 08:00:00 3 g via INTRAVENOUS
  Filled 2019-07-01 (×3): qty 3000

## 2019-07-01 NOTE — Anesthesia Preprocedure Evaluation (Addendum)
Anesthesia Evaluation  Patient identified by MRN, date of birth, ID band Patient awake    Reviewed: Allergy & Precautions, H&P , NPO status , Patient's Chart, lab work & pertinent test results  Airway Mallampati: II   Neck ROM: full    Dental   Pulmonary Current Smoker and Patient abstained from smoking.,    breath sounds clear to auscultation       Cardiovascular hypertension,  Rhythm:regular Rate:Normal     Neuro/Psych  Headaches,    GI/Hepatic GERD  ,  Endo/Other  diabetes, Type 2Morbid obesity  Renal/GU Renal InsufficiencyRenal disease     Musculoskeletal  (+) Arthritis ,   Abdominal   Peds  Hematology   Anesthesia Other Findings   Reproductive/Obstetrics Breast CA                            Anesthesia Physical Anesthesia Plan  ASA: III  Anesthesia Plan: General   Post-op Pain Management:  Regional for Post-op pain   Induction: Intravenous  PONV Risk Score and Plan: 2 and Ondansetron, Dexamethasone, Midazolam, Scopolamine patch - Pre-op and Treatment may vary due to age or medical condition  Airway Management Planned: LMA  Additional Equipment:   Intra-op Plan:   Post-operative Plan:   Informed Consent: I have reviewed the patients History and Physical, chart, labs and discussed the procedure including the risks, benefits and alternatives for the proposed anesthesia with the patient or authorized representative who has indicated his/her understanding and acceptance.       Plan Discussed with: CRNA, Anesthesiologist and Surgeon  Anesthesia Plan Comments: (PAT note written 07/01/2019 by Myra Gianotti, PA-C. Cardiology clearance by Dr. Darral Dash. )       Anesthesia Quick Evaluation

## 2019-07-02 ENCOUNTER — Encounter (HOSPITAL_COMMUNITY): Payer: Self-pay | Admitting: *Deleted

## 2019-07-02 ENCOUNTER — Other Ambulatory Visit: Payer: Self-pay

## 2019-07-02 ENCOUNTER — Ambulatory Visit (HOSPITAL_COMMUNITY)
Admission: RE | Admit: 2019-07-02 | Discharge: 2019-07-02 | Disposition: A | Discharge status: Home / Self Care | Source: Ambulatory Visit | Attending: Surgery | Admitting: Surgery

## 2019-07-02 ENCOUNTER — Ambulatory Visit (HOSPITAL_COMMUNITY): Admitting: Vascular Surgery

## 2019-07-02 ENCOUNTER — Encounter (HOSPITAL_COMMUNITY)
Admission: RE | Disposition: A | Discharge status: Home / Self Care | Payer: Self-pay | Source: Ambulatory Visit | Attending: Surgery

## 2019-07-02 ENCOUNTER — Ambulatory Visit (HOSPITAL_COMMUNITY): Admitting: Certified Registered Nurse Anesthetist

## 2019-07-02 ENCOUNTER — Ambulatory Visit
Admission: RE | Admit: 2019-07-02 | Discharge: 2019-07-02 | Disposition: A | Discharge status: Home / Self Care | Source: Ambulatory Visit | Attending: Surgery | Admitting: Surgery

## 2019-07-02 DIAGNOSIS — M1990 Unspecified osteoarthritis, unspecified site: Secondary | ICD-10-CM | POA: Diagnosis not present

## 2019-07-02 DIAGNOSIS — Z8249 Family history of ischemic heart disease and other diseases of the circulatory system: Secondary | ICD-10-CM | POA: Diagnosis not present

## 2019-07-02 DIAGNOSIS — C50911 Malignant neoplasm of unspecified site of right female breast: Secondary | ICD-10-CM | POA: Diagnosis not present

## 2019-07-02 DIAGNOSIS — Z885 Allergy status to narcotic agent status: Secondary | ICD-10-CM | POA: Diagnosis not present

## 2019-07-02 DIAGNOSIS — C773 Secondary and unspecified malignant neoplasm of axilla and upper limb lymph nodes: Secondary | ICD-10-CM | POA: Insufficient documentation

## 2019-07-02 DIAGNOSIS — K279 Peptic ulcer, site unspecified, unspecified as acute or chronic, without hemorrhage or perforation: Secondary | ICD-10-CM | POA: Insufficient documentation

## 2019-07-02 DIAGNOSIS — F17200 Nicotine dependence, unspecified, uncomplicated: Secondary | ICD-10-CM | POA: Diagnosis not present

## 2019-07-02 DIAGNOSIS — E7800 Pure hypercholesterolemia, unspecified: Secondary | ICD-10-CM | POA: Diagnosis not present

## 2019-07-02 DIAGNOSIS — K219 Gastro-esophageal reflux disease without esophagitis: Secondary | ICD-10-CM | POA: Diagnosis not present

## 2019-07-02 DIAGNOSIS — Z79899 Other long term (current) drug therapy: Secondary | ICD-10-CM | POA: Diagnosis not present

## 2019-07-02 DIAGNOSIS — Z809 Family history of malignant neoplasm, unspecified: Secondary | ICD-10-CM | POA: Insufficient documentation

## 2019-07-02 DIAGNOSIS — Z170 Estrogen receptor positive status [ER+]: Secondary | ICD-10-CM | POA: Insufficient documentation

## 2019-07-02 DIAGNOSIS — Z8341 Family history of multiple endocrine neoplasia [MEN] syndrome: Secondary | ICD-10-CM | POA: Diagnosis not present

## 2019-07-02 DIAGNOSIS — Z9049 Acquired absence of other specified parts of digestive tract: Secondary | ICD-10-CM | POA: Diagnosis not present

## 2019-07-02 DIAGNOSIS — Z811 Family history of alcohol abuse and dependence: Secondary | ICD-10-CM | POA: Diagnosis not present

## 2019-07-02 DIAGNOSIS — R030 Elevated blood-pressure reading, without diagnosis of hypertension: Secondary | ICD-10-CM | POA: Insufficient documentation

## 2019-07-02 HISTORY — PX: BREAST LUMPECTOMY WITH RADIOACTIVE SEED AND SENTINEL LYMPH NODE BIOPSY: SHX6550

## 2019-07-02 LAB — GLUCOSE, CAPILLARY: Glucose-Capillary: 131 mg/dL — ABNORMAL HIGH (ref 70–99)

## 2019-07-02 SURGERY — BREAST LUMPECTOMY WITH RADIOACTIVE SEED AND SENTINEL LYMPH NODE BIOPSY
Anesthesia: General | Site: Breast | Laterality: Right

## 2019-07-02 MED ORDER — BUPIVACAINE-EPINEPHRINE (PF) 0.25% -1:200000 IJ SOLN
INTRAMUSCULAR | Status: AC
  Filled 2019-07-02: qty 30

## 2019-07-02 MED ORDER — SUGAMMADEX SODIUM 500 MG/5ML IV SOLN
INTRAVENOUS | Status: DC | PRN
  Administered 2019-07-02: 300 mg via INTRAVENOUS

## 2019-07-02 MED ORDER — MIDAZOLAM HCL 2 MG/2ML IJ SOLN
INTRAMUSCULAR | Status: AC
  Filled 2019-07-02: qty 2

## 2019-07-02 MED ORDER — PROPOFOL 10 MG/ML IV BOLUS
INTRAVENOUS | Status: AC
  Filled 2019-07-02: qty 20

## 2019-07-02 MED ORDER — LIDOCAINE 2% (20 MG/ML) 5 ML SYRINGE
INTRAMUSCULAR | Status: AC
  Filled 2019-07-02: qty 5

## 2019-07-02 MED ORDER — ONDANSETRON HCL 4 MG/2ML IJ SOLN
INTRAMUSCULAR | Status: AC
  Filled 2019-07-02: qty 2

## 2019-07-02 MED ORDER — FENTANYL CITRATE (PF) 250 MCG/5ML IJ SOLN
INTRAMUSCULAR | Status: AC
  Filled 2019-07-02: qty 5

## 2019-07-02 MED ORDER — MIDAZOLAM HCL 5 MG/5ML IJ SOLN
INTRAMUSCULAR | Status: DC | PRN
  Administered 2019-07-02: 2 mg via INTRAVENOUS

## 2019-07-02 MED ORDER — GABAPENTIN 300 MG PO CAPS
ORAL_CAPSULE | ORAL | Status: AC
  Administered 2019-07-02: 300 mg
  Filled 2019-07-02: qty 1

## 2019-07-02 MED ORDER — ROCURONIUM BROMIDE 100 MG/10ML IV SOLN
INTRAVENOUS | Status: DC | PRN
  Administered 2019-07-02: 50 mg via INTRAVENOUS

## 2019-07-02 MED ORDER — SUCCINYLCHOLINE CHLORIDE 200 MG/10ML IV SOSY
PREFILLED_SYRINGE | INTRAVENOUS | Status: DC | PRN
  Administered 2019-07-02: 100 mg via INTRAVENOUS

## 2019-07-02 MED ORDER — METHYLENE BLUE 0.5 % INJ SOLN
INTRAVENOUS | Status: AC
  Filled 2019-07-02: qty 10

## 2019-07-02 MED ORDER — CHLORHEXIDINE GLUCONATE CLOTH 2 % EX PADS: 6.0000 | MEDICATED_PAD | Freq: Once | CUTANEOUS | Status: DC

## 2019-07-02 MED ORDER — ACETAMINOPHEN 500 MG PO TABS
ORAL_TABLET | ORAL | Status: AC
  Administered 2019-07-02: 1000 mg
  Filled 2019-07-02: qty 2

## 2019-07-02 MED ORDER — ACETAMINOPHEN 500 MG PO TABS: 1000.0000 mg | ORAL_TABLET | ORAL | Status: DC

## 2019-07-02 MED ORDER — BUPIVACAINE-EPINEPHRINE 0.25% -1:200000 IJ SOLN
INTRAMUSCULAR | Status: DC | PRN
  Administered 2019-07-02 (×2): 10 mL

## 2019-07-02 MED ORDER — SUCCINYLCHOLINE CHLORIDE 200 MG/10ML IV SOSY
PREFILLED_SYRINGE | INTRAVENOUS | Status: AC
  Filled 2019-07-02: qty 10

## 2019-07-02 MED ORDER — TECHNETIUM TC 99M SULFUR COLLOID FILTERED
1.0000 | Freq: Once | INTRAVENOUS | Status: AC | PRN
  Administered 2019-07-02: 1 via INTRADERMAL

## 2019-07-02 MED ORDER — SODIUM CHLORIDE (PF) 0.9 % IJ SOLN
INTRAVENOUS | Status: DC | PRN
  Administered 2019-07-02: 5 mL

## 2019-07-02 MED ORDER — ONDANSETRON HCL 4 MG/2ML IJ SOLN
INTRAMUSCULAR | Status: DC | PRN
  Administered 2019-07-02: 4 mg via INTRAVENOUS

## 2019-07-02 MED ORDER — DEXAMETHASONE SODIUM PHOSPHATE 10 MG/ML IJ SOLN
INTRAMUSCULAR | Status: AC
  Filled 2019-07-02: qty 1

## 2019-07-02 MED ORDER — LACTATED RINGERS IV SOLN
INTRAVENOUS | Status: DC | PRN
  Administered 2019-07-02: 07:00:00 via INTRAVENOUS

## 2019-07-02 MED ORDER — OXYCODONE HCL 5 MG PO TABS: 5.0000 mg | ORAL_TABLET | Freq: Four times a day (QID) | ORAL | 0 refills | Status: DC | PRN

## 2019-07-02 MED ORDER — 0.9 % SODIUM CHLORIDE (POUR BTL) OPTIME
TOPICAL | Status: DC | PRN
  Administered 2019-07-02: 1000 mL

## 2019-07-02 MED ORDER — GABAPENTIN 300 MG PO CAPS: 300.0000 mg | ORAL_CAPSULE | ORAL | Status: DC

## 2019-07-02 MED ORDER — PHENYLEPHRINE 40 MCG/ML (10ML) SYRINGE FOR IV PUSH (FOR BLOOD PRESSURE SUPPORT)
PREFILLED_SYRINGE | INTRAVENOUS | Status: AC
  Filled 2019-07-02: qty 10

## 2019-07-02 MED ORDER — PROPOFOL 10 MG/ML IV BOLUS
INTRAVENOUS | Status: DC | PRN
  Administered 2019-07-02: 100 mg via INTRAVENOUS
  Administered 2019-07-02: 200 mg via INTRAVENOUS

## 2019-07-02 MED ORDER — ROCURONIUM BROMIDE 50 MG/5ML IV SOSY: PREFILLED_SYRINGE | INTRAVENOUS | Status: DC | PRN

## 2019-07-02 MED ORDER — FENTANYL CITRATE (PF) 100 MCG/2ML IJ SOLN
INTRAMUSCULAR | Status: DC | PRN
  Administered 2019-07-02 (×2): 50 ug via INTRAVENOUS

## 2019-07-02 MED ORDER — BUPIVACAINE-EPINEPHRINE (PF) 0.5% -1:200000 IJ SOLN
INTRAMUSCULAR | Status: DC | PRN
  Administered 2019-07-02: 25 mL via PERINEURAL

## 2019-07-02 MED ORDER — LIDOCAINE 2% (20 MG/ML) 5 ML SYRINGE
INTRAMUSCULAR | Status: DC | PRN
  Administered 2019-07-02: 60 mg via INTRAVENOUS

## 2019-07-02 MED ORDER — STERILE WATER FOR IRRIGATION IR SOLN
Status: DC | PRN
  Administered 2019-07-02: 1000 mL

## 2019-07-02 MED ORDER — DEXAMETHASONE SODIUM PHOSPHATE 10 MG/ML IJ SOLN
INTRAMUSCULAR | Status: DC | PRN
  Administered 2019-07-02: 10 mg via INTRAVENOUS

## 2019-07-02 SURGICAL SUPPLY — 38 items
APPLIER CLIP 9.375 MED OPEN (MISCELLANEOUS) ×3
BENZOIN TINCTURE PRP APPL 2/3 (GAUZE/BANDAGES/DRESSINGS) ×6 IMPLANT
CANISTER SUCT 3000ML PPV (MISCELLANEOUS) ×3 IMPLANT
CHLORAPREP W/TINT 26 (MISCELLANEOUS) ×3 IMPLANT
CLIP APPLIE 9.375 MED OPEN (MISCELLANEOUS) ×1 IMPLANT
CLOSURE WOUND 1/2 X4 (GAUZE/BANDAGES/DRESSINGS) ×1
CONT SPEC 4OZ CLIKSEAL STRL BL (MISCELLANEOUS) ×3 IMPLANT
COVER PROBE W GEL 5X96 (DRAPES) ×3 IMPLANT
COVER SURGICAL LIGHT HANDLE (MISCELLANEOUS) ×3 IMPLANT
COVER WAND RF STERILE (DRAPES) ×3 IMPLANT
DEVICE DUBIN SPECIMEN MAMMOGRA (MISCELLANEOUS) ×3 IMPLANT
DRAPE CHEST BREAST 15X10 FENES (DRAPES) ×3 IMPLANT
DRSG TEGADERM 4X4.75 (GAUZE/BANDAGES/DRESSINGS) ×6 IMPLANT
ELECT CAUTERY BLADE 6.4 (BLADE) ×3 IMPLANT
ELECT REM PT RETURN 9FT ADLT (ELECTROSURGICAL) ×3
ELECTRODE REM PT RTRN 9FT ADLT (ELECTROSURGICAL) ×1 IMPLANT
GAUZE SPONGE 4X4 12PLY STRL (GAUZE/BANDAGES/DRESSINGS) ×6 IMPLANT
GLOVE BIO SURGEON STRL SZ7 (GLOVE) ×3 IMPLANT
GLOVE BIOGEL PI IND STRL 7.5 (GLOVE) ×1 IMPLANT
GLOVE BIOGEL PI INDICATOR 7.5 (GLOVE) ×2
GOWN STRL REUS W/ TWL LRG LVL3 (GOWN DISPOSABLE) ×2 IMPLANT
GOWN STRL REUS W/TWL LRG LVL3 (GOWN DISPOSABLE) ×4
KIT BASIN OR (CUSTOM PROCEDURE TRAY) ×3 IMPLANT
KIT MARKER MARGIN INK (KITS) ×3 IMPLANT
NEEDLE 18GX1X1/2 (RX/OR ONLY) (NEEDLE) ×3 IMPLANT
NEEDLE FILTER BLUNT 18X 1/2SAF (NEEDLE) ×2
NEEDLE FILTER BLUNT 18X1 1/2 (NEEDLE) ×1 IMPLANT
NEEDLE HYPO 25GX1X1/2 BEV (NEEDLE) ×6 IMPLANT
NS IRRIG 1000ML POUR BTL (IV SOLUTION) ×3 IMPLANT
PACK GENERAL/GYN (CUSTOM PROCEDURE TRAY) ×3 IMPLANT
SPONGE LAP 4X18 RFD (DISPOSABLE) ×3 IMPLANT
STRIP CLOSURE SKIN 1/2X4 (GAUZE/BANDAGES/DRESSINGS) ×2 IMPLANT
SUT MNCRL AB 4-0 PS2 18 (SUTURE) ×6 IMPLANT
SUT VIC AB 3-0 SH 27 (SUTURE) ×4
SUT VIC AB 3-0 SH 27X BRD (SUTURE) ×2 IMPLANT
SYR CONTROL 10ML LL (SYRINGE) ×6 IMPLANT
TOWEL GREEN STERILE (TOWEL DISPOSABLE) ×3 IMPLANT
TOWEL GREEN STERILE FF (TOWEL DISPOSABLE) ×3 IMPLANT

## 2019-07-02 NOTE — Discharge Instructions (Signed)
[facility] °Office Phone Number [phone] ° °BREAST BIOPSY/ PARTIAL MASTECTOMY: POST OP INSTRUCTIONS ° °Always review your discharge instruction sheet given to you by the facility where your surgery was performed. ° °IF YOU HAVE DISABILITY OR FAMILY LEAVE FORMS, YOU MUST BRING THEM TO THE OFFICE FOR PROCESSING.  DO NOT GIVE THEM TO YOUR DOCTOR. ° °1. A prescription for pain medication may be given to you upon discharge.  Take your pain medication as prescribed, if needed.  If narcotic pain medicine is not needed, then you may take acetaminophen (Tylenol) or ibuprofen (Advil) as needed. °2. Take your usually prescribed medications unless otherwise directed °3. If you need a refill on your pain medication, please contact your pharmacy.  They will contact our office to request authorization.  Prescriptions will not be filled after 5pm or on week-ends. °4. You should eat very light the first 24 hours after surgery, such as soup, crackers, pudding, etc.  Resume your normal diet the day after surgery. °5. Most patients will experience some swelling and bruising in the breast.  Ice packs and a good support bra will help.  Swelling and bruising can take several days to resolve.  °6. It is common to experience some constipation if taking pain medication after surgery.  Increasing fluid intake and taking a stool softener will usually help or prevent this problem from occurring.  A mild laxative (Milk of Magnesia or Miralax) should be taken according to package directions if there are no bowel movements after 48 hours. °7. Unless discharge instructions indicate otherwise, you may remove your bandages 24-48 hours after surgery, and you may shower at that time.  You may have steri-strips (small skin tapes) in place directly over the incision.  These strips should be left on the skin for 7-10 days.  If your surgeon used skin glue on the incision, you may shower in 24 hours.  The glue will flake off over the  next 2-3 weeks.  Any sutures or staples will be removed at the office during your follow-up visit. °8. ACTIVITIES:  You may resume regular daily activities (gradually increasing) beginning the next day.  Wearing a good support bra or sports bra minimizes pain and swelling.  You may have sexual intercourse when it is comfortable. °a. You may drive when you no longer are taking prescription pain medication, you can comfortably wear a seatbelt, and you can safely maneuver your car and apply brakes. °b. RETURN TO WORK:  ______________________________________________________________________________________ °9. You should see your doctor in the office for a follow-up appointment approximately two weeks after your surgery.  Your doctor’s nurse will typically make your follow-up appointment when she calls you with your pathology report.  Expect your pathology report 2-3 business days after your surgery.  You may call to check if you do not hear from us after three days. °10. OTHER INSTRUCTIONS: _______________________________________________________________________________________________ _____________________________________________________________________________________________________________________________________ °_____________________________________________________________________________________________________________________________________ °_____________________________________________________________________________________________________________________________________ ° °WHEN TO CALL YOUR DOCTOR: °1. Fever over 101.0 °2. Nausea and/or vomiting. °3. Extreme swelling or bruising. °4. Continued bleeding from incision. °5. Increased pain, redness, or drainage from the incision. ° °The clinic staff is available to answer your questions during regular business hours.  Please don’t hesitate to call and ask to speak to one of the nurses for clinical concerns.  If you have a medical emergency, go to the nearest  emergency room or call 911.  A surgeon from [facility] is always on call at the hospital. ° °For further questions, please visit [email or web address]  °

## 2019-07-02 NOTE — Op Note (Signed)
Pre-op Diagnosis:  Right invasive lobular carcinoma Post-op Diagnosis: same Procedure:  Right radioactive seed localized lumpectomy/ blue dye injection/ right axillary sentinel lymph node biopsy Surgeon:  Letia Guidry K. Anesthesia:  GEN - LMA Indications:  This is a 48 year old female who is perimenopausal who presents from [place] for new diagnosis of invasive lobular carcinoma of the right breast. The patient has been very good about keeping up with her annual mammograms. She had an abnormal finding in the retroareolar region on the right. She underwent further workup including ultrasound and core biopsy. Ultrasound showed an 8 mm hypoechoic area 9 o'clock position 1 cm from the nipple. Biopsy showed invasive lobular carcinoma. Ki-67 is 30%. ER and PR both positive. HER-2 is negative.   Description of procedure:  In the pre-op holding area, she received a pectoral block.  She was also injected with radiotracer by radiology.  A seed was placed yesterday.  The patient is brought to the operating room placed in supine position on the operating room table. After an adequate level of general anesthesia was obtained, her right breast was prepped with ChloraPrep and draped in sterile fashion. A timeout was taken to ensure the proper patient and proper procedure. We interrogated the breast with the neoprobe. We made a circumareolar incision around the lateral side of the nipple after infiltrating with 0.25% Marcaine. Dissection was carried down in the breast tissue with cautery. We used the neoprobe to guide [place] towards the radioactive seed. We excised an area of tissue around the radioactive seed two cm in diameter. The specimen was removed and was oriented with a paint kit. Specimen mammogram showed the radioactive seed as well as the biopsy clip within the specimen. This was sent for pathologic examination. There is no residual radioactivity within the biopsy cavity. We inspected carefully  for hemostasis. The wound was thoroughly irrigated. The wound was closed with a deep layer of 3-0 Vicryl and a subcuticular layer of 4-0 Monocryl.   We then turned our attention to the axilla.  We interrogated with the neoprobe on the sentinel lymph node settings.  I made a transverse incision along the skin fold.  We dissected down into the axilla.  The axilla is fairly deep because of the patient's size.  We were able to identify 2 hot blue lymph nodes.  These were excised as sentinel lymph node #1 and #2.  There was minimal background activity once these were excised.  We irrigated thoroughly and inspected for hemostasis.  The wound was closed with a deep layer of 3-0 Vicryl and a subcuticular layer 4-0 Monocryl.  Benzoin Steri-Strips were applied to both incisions.  Sterile dressings were applied to both incisions.  The patient was then extubated and brought to the recovery room in stable condition. All sponge, instrument, and needle counts are correct.  Imogene Burn. [name], MD, [facility]  General/ Trauma Surgery  07/02/2019 9:05 AM

## 2019-07-02 NOTE — Anesthesia Procedure Notes (Addendum)
Procedure Name: Intubation Date/Time: 07/02/2019 7:49 AM Performed by: Janene Harvey, CRNA Pre-anesthesia Checklist: Patient identified, Emergency Drugs available, Suction available and Patient being monitored Patient Re-evaluated:Patient Re-evaluated prior to induction Oxygen Delivery Method: Circle system utilized Preoxygenation: Pre-oxygenation with 100% oxygen Induction Type: IV induction Ventilation: Two handed mask ventilation required Laryngoscope Size: Mac and 3 Tube type: Oral Tube size: 7.0 mm Number of attempts: 1 Airway Equipment and Method: Stylet Placement Confirmation: ETT inserted through vocal cords under direct vision,  positive ETCO2 and breath sounds checked- equal and bilateral Secured at: 22 cm Tube secured with: Tape Dental Injury: Teeth and Oropharynx as per pre-operative assessment  Comments: LMA 4 initially placed, not seated well. Elected to intubate, LMA removed, pt 2 hand mask with sevo, succinycholine given. DL by CRNA with grade 3 view. DL with MAC 3 by [place], 7.0 ett placed.

## 2019-07-02 NOTE — Transfer of Care (Signed)
Immediate Anesthesia Transfer of Care Note  Patient: Sandra Patterson  Procedure(s) Performed: RIGHT BREAST LUMPECTOMY WITH RADIOACTIVE SEED AND SENTINEL LYMPH NODE BIOPSY (Right Breast)  Patient Location: PACU  Anesthesia Type:General  Level of Consciousness: awake  Airway & Oxygen Therapy: Patient Spontanous Breathing and Patient connected to face mask oxygen  Post-op Assessment: Report given to RN and Post -op Vital signs reviewed and stable  Post vital signs: Reviewed  Last Vitals:  Vitals Value Taken Time  BP 154/77 07/02/19 0914  Temp    Pulse 81 07/02/19 0918  Resp 21 07/02/19 0918  SpO2 93 % 07/02/19 0918  Vitals shown include unvalidated device data.  Last Pain:  Vitals:   07/02/19 0611  TempSrc:   PainSc: 4          Complications: No apparent anesthesia complications

## 2019-07-02 NOTE — Anesthesia Postprocedure Evaluation (Signed)
Anesthesia Post Note  Patient: Sandra Patterson  Procedure(s) Performed: RIGHT BREAST LUMPECTOMY WITH RADIOACTIVE SEED AND SENTINEL LYMPH NODE BIOPSY (Right Breast)     Patient location during evaluation: PACU Anesthesia Type: General Level of consciousness: awake and alert Pain management: pain level controlled Vital Signs Assessment: post-procedure vital signs reviewed and stable Respiratory status: spontaneous breathing, nonlabored ventilation, respiratory function stable and patient connected to nasal cannula oxygen Cardiovascular status: blood pressure returned to baseline and stable Postop Assessment: no apparent nausea or vomiting Anesthetic complications: no    Last Vitals:  Vitals:   07/02/19 0914 07/02/19 0931  BP: (!) 154/77 (!) 159/85  Pulse: 83 79  Resp: 20 18  Temp: 36.9 C 36.8 C  SpO2: 95% 94%    Last Pain:  Vitals:   07/02/19 0931  TempSrc:   PainSc: 2                  Laniah Grimm S

## 2019-07-02 NOTE — Interval H&P Note (Signed)
History and Physical Interval Note:  07/02/2019 6:49 AM  Sandra Patterson  has presented today for surgery, with the diagnosis of RIGHT INVASIVCE LOBULAR CARCINOMA.  The various methods of treatment have been discussed with the patient and family. After consideration of risks, benefits and other options for treatment, the patient has consented to  Procedure(s): RIGHT BREAST LUMPECTOMY WITH RADIOACTIVE SEED AND SENTINEL LYMPH NODE BIOPSY (Right) as a surgical intervention.  The patient's history has been reviewed, patient examined, no change in status, stable for surgery.  I have reviewed the patient's chart and labs.  Questions were answered to the patient's satisfaction.     Maia Petties

## 2019-07-02 NOTE — Anesthesia Procedure Notes (Signed)
Anesthesia Regional Block: Pectoralis block   Pre-Anesthetic Checklist: ,, timeout performed, Correct Patient, Correct Site, Correct Laterality, Correct Procedure, Correct Position, site marked, Risks and benefits discussed,  Surgical consent,  Pre-op evaluation,  At surgeon's request and post-op pain management  Laterality: Right  Prep: chloraprep       Needles:  Injection technique: Single-shot  Needle Type: Echogenic Needle     Needle Length: 9cm  Needle Gauge: 21     Additional Needles:   Narrative:  Start time: 07/02/2019 7:01 AM End time: 07/02/2019 7:08 AM Injection made incrementally with aspirations every 5 mL.  Performed by: Personally  Anesthesiologist: Albertha Ghee, MD  Additional Notes: Pt tolerated the procedure well.

## 2019-07-03 ENCOUNTER — Encounter (HOSPITAL_COMMUNITY): Payer: Self-pay | Admitting: Surgery

## 2019-07-05 NOTE — Assessment & Plan Note (Signed)
06/23/2019:Routine screening mammogram detected an 0.8cm mass in the right breast at the 9:00 position 1 cm from the nipple. 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  T1CN0 stage Ia clinical stage History of rheumatoid arthritis and lupus and hypertension  Treatment plan: 1. Breast conserving surgery followed by 2. Oncotype DX testing to determine if chemotherapy would be of any benefit followed by 3. Adjuvant radiation therapy followed by 4. Adjuvant antiestrogen therapy ---------------------------------------------------------------------------------------------------------------- Right lumpectomy:  Pathology counseling: I discussed the final pathology report of the patient provided  a copy of this report. I discussed the margins as well as lymph node surgeries. We also discussed the final staging along with previously performed ER/PR and HER-2/neu testing.  Return to clinic based upon Oncotype test results.

## 2019-07-06 ENCOUNTER — Telehealth: Payer: Self-pay | Admitting: *Deleted

## 2019-07-06 NOTE — Telephone Encounter (Signed)
Received order for oncotype testing. Requisition sent to pathology and [facility] 

## 2019-07-09 ENCOUNTER — Telehealth: Payer: Self-pay | Admitting: Hematology and Oncology

## 2019-07-09 NOTE — Telephone Encounter (Signed)
Contacted patient to verify webex visit for pre reg °

## 2019-07-11 NOTE — Progress Notes (Signed)
HEMATOLOGY-ONCOLOGY DOXIMITY VISIT PROGRESS NOTE  I connected with Ivelisse Culverhouse on 07/12/2019 at  8:45 AM EDT by Doximity video conference and verified that I am speaking with the correct person using two identifiers.  I discussed the limitations, risks, security and privacy concerns of performing an evaluation and management service by Doximity and the availability of in person appointments.  I also discussed with the patient that there may be a patient responsible charge related to this service. The patient expressed understanding and agreed to proceed.  Patient's Location: Home Physician Location: Clinic  CHIEF COMPLIANT: Follow-up s/p lumpectomy to review pathology  INTERVAL HISTORY: Sandra Patterson is a 48 y.o. female with above-mentioned history of right breast cancer. She underwent a lumpectomy on 07/02/19 with Dr. 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 She presents over Doximity today to review the pathology report and discuss further treatment.  She is slightly sore underneath the right axilla but otherwise recovering very well from recent surgery.  Oncology History  Malignant neoplasm of upper-outer quadrant of right breast in female, estrogen receptor positive ([place])  06/23/2019 Initial Diagnosis   Routine screening mammogram detected an 0.8cm mass in the right breast at the 9:00 position 1 cm from the nipple. 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    06/23/2019 Cancer Staging   Staging form: Breast, AJCC 8th Edition - Clinical stage from 06/23/2019: Stage IA (cT1b, cN0, cM0, G2, ER+, PR+, HER2-) - Signed by [name], NP on 06/30/2019   07/02/2019 Surgery   Right lumpectomy ([place]): invasive lobular carcinoma with LCIS, 1.4cm, grade 2, clear margins, and isolated tumor cells present in 1 of 2 lymph nodes.       REVIEW OF SYSTEMS:   Constitutional: Denies fevers, chills or abnormal weight loss Eyes: Denies blurriness of vision Ears, nose, mouth, throat, and face: Denies mucositis or sore throat Respiratory: Denies cough, dyspnea or wheezes Cardiovascular: Denies palpitation, chest discomfort Gastrointestinal:  Denies nausea, heartburn or change in bowel habits Skin: Denies abnormal skin rashes Lymphatics: Denies new lymphadenopathy or easy bruising Neurological:Denies numbness, tingling or new weaknesses Behavioral/Psych: Mood is stable, no new changes  Extremities: No lower extremity edema Breast: denies any pain or lumps or nodules in either breasts All other systems were reviewed with the patient and are negative.  Observations/Objective:   I have reviewed the data as listed CMP Latest Ref Rng & Units 06/29/2019  Glucose 70 - 99 mg/dL 124(H)  BUN 6 - 20 mg/dL 9  Creatinine 0.44 - 1.00 mg/dL 1.53(H)  Sodium 135 - 145 mmol/L 135  Potassium 3.5 - 5.1 mmol/L 3.4(L)  Chloride 98 - 111 mmol/L 101  CO2 22 - 32 mmol/L 24  Calcium 8.9 - 10.3 mg/dL 9.8    Lab Results  Component Value Date   WBC 7.2 06/29/2019   HGB 15.5 (H) 06/29/2019   HCT 47.5 (H) 06/29/2019   MCV 95.6 06/29/2019   PLT 175 06/29/2019      Assessment Plan:  Malignant neoplasm of upper-outer quadrant of right breast in female, estrogen receptor positive ([place]) 06/23/2019:Routine screening mammogram detected an 0.8cm mass in the right breast at the 9:00 position 1 cm from the nipple. 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  T1CN0 stage Ia clinical stage History of rheumatoid arthritis and lupus and hypertension  Treatment  plan: 1. Breast conserving surgery followed by 2. Oncotype DX testing to determine if chemotherapy would be of any benefit followed by 3. Adjuvant radiation therapy followed by 4. Adjuvant antiestrogen therapy  ---------------------------------------------------------------------------------------------------------------- Right lumpectomy:Right lumpectomy ([place]): invasive lobular carcinoma with LCIS, 1.4cm, grade 2, clear margins, and isolated tumor cells present in 1 of 2 lymph nodes. T1c N0 stage Ia  Pathology counseling: I discussed the final pathology report of the patient provided  a copy of this report. I discussed the margins as well as lymph node surgeries. We also discussed the final staging along with previously performed ER/PR and HER-2/neu testing.  Even though there were isolated tumor cells it is considered to be N0.  Return to clinic based upon Oncotype test results.   I discussed the assessment and treatment plan with the patient. The patient was provided an opportunity to ask questions and all were answered. The patient agreed with the plan and demonstrated an understanding of the instructions. The patient was advised to call back or seek an in-person evaluation if the symptoms worsen or if the condition fails to improve as anticipated.   I provided 15 minutes of face-to-face Doximity time during this encounter.    Rulon Eisenmenger, MD 07/12/2019   I, Molly Dorshimer, am acting as scribe for [name], MD.  I have reviewed the above documentation for accuracy and completeness, and I agree with the above.

## 2019-07-12 ENCOUNTER — Other Ambulatory Visit: Payer: Self-pay

## 2019-07-12 ENCOUNTER — Inpatient Hospital Stay (HOSPITAL_BASED_OUTPATIENT_CLINIC_OR_DEPARTMENT_OTHER): Admitting: Hematology and Oncology

## 2019-07-12 ENCOUNTER — Ambulatory Visit: Payer: Self-pay | Admitting: Oncology

## 2019-07-12 DIAGNOSIS — C50411 Malignant neoplasm of upper-outer quadrant of right female breast: Secondary | ICD-10-CM | POA: Diagnosis not present

## 2019-07-12 DIAGNOSIS — Z170 Estrogen receptor positive status [ER+]: Secondary | ICD-10-CM

## 2019-07-13 ENCOUNTER — Telehealth: Payer: Self-pay | Admitting: Genetic Counselor

## 2019-07-13 NOTE — Telephone Encounter (Signed)
Called patient regarding upcoming Webex appointment, patient would prefer this to be a walk-in visit. °

## 2019-07-14 ENCOUNTER — Inpatient Hospital Stay

## 2019-07-14 ENCOUNTER — Other Ambulatory Visit: Payer: Self-pay | Admitting: Genetic Counselor

## 2019-07-14 ENCOUNTER — Inpatient Hospital Stay: Attending: Oncology | Admitting: Genetic Counselor

## 2019-07-14 ENCOUNTER — Encounter: Payer: Self-pay | Admitting: Genetic Counselor

## 2019-07-14 ENCOUNTER — Other Ambulatory Visit: Payer: Self-pay

## 2019-07-14 DIAGNOSIS — Z79899 Other long term (current) drug therapy: Secondary | ICD-10-CM | POA: Insufficient documentation

## 2019-07-14 DIAGNOSIS — C50411 Malignant neoplasm of upper-outer quadrant of right female breast: Secondary | ICD-10-CM | POA: Diagnosis not present

## 2019-07-14 DIAGNOSIS — Z170 Estrogen receptor positive status [ER+]: Secondary | ICD-10-CM

## 2019-07-14 DIAGNOSIS — I10 Essential (primary) hypertension: Secondary | ICD-10-CM | POA: Insufficient documentation

## 2019-07-14 DIAGNOSIS — Z8042 Family history of malignant neoplasm of prostate: Secondary | ICD-10-CM

## 2019-07-14 DIAGNOSIS — E785 Hyperlipidemia, unspecified: Secondary | ICD-10-CM | POA: Insufficient documentation

## 2019-07-14 NOTE — Progress Notes (Signed)
REFERRING PROVIDER: Hayden Pedro, PA-C [place],  [place] [address]  PRIMARY PROVIDER:  [name], FNP  PRIMARY REASON FOR VISIT:  1. Malignant neoplasm of upper-outer quadrant of right breast in female, estrogen receptor positive ([place])   2. Family history of prostate cancer      HISTORY OF PRESENT ILLNESS:   Sandra Patterson, a 48 y.o. female, was seen for a [place] cancer genetics consultation at the request of Dr. Dara Lords due to a personal and family history of cancer.  Sandra Patterson presents to clinic today to discuss the possibility of a hereditary predisposition to cancer, genetic testing, and to further clarify her future cancer risks, as well as potential cancer risks for family members.   In August 2020, at the age of 52, Sandra Patterson was diagnosed with invasive lobular carcinoma of the right breast. The treatment plan includes lumpectomy and radiation.    CANCER HISTORY:  Oncology History  Malignant neoplasm of upper-outer quadrant of right breast in female, estrogen receptor positive ([place])  06/23/2019 Initial Diagnosis   Routine screening mammogram detected an 0.8cm mass in the right breast at the 9:00 position 1 cm from the nipple. 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    06/23/2019 Cancer Staging   Staging form: Breast, AJCC 8th Edition - Clinical stage from 06/23/2019: Stage IA (cT1b, cN0, cM0, G2, ER+, PR+, HER2-) - Signed by [name], NP on 06/30/2019   07/02/2019 Surgery   Right lumpectomy (Tsuei): invasive lobular carcinoma with LCIS, 1.4cm, grade 2, clear margins, and isolated tumor cells present in 1 of 2 lymph nodes.    07/02/2019 Cancer Staging   Staging form: Breast, AJCC 8th Edition - Pathologic stage from 07/02/2019: Stage IA (pT1c, pN0(i+), cM0, G2, ER+, PR+, HER2-) - Signed by [name], MD on 07/12/2019      RISK FACTORS:  Menarche was at age 11.  First live birth at age  73.  OCP use for approximately 10 years.  Ovaries intact: yes.  Hysterectomy: no.  Menopausal status: perimenopausal.  HRT use: 0 years. Colonoscopy: yes; 2 polyps. Mammogram within the last year: yes. Number of breast biopsies: 1. Up to date with pelvic exams: yes. Any excessive radiation exposure in the past: no  Past Medical History:  Diagnosis Date  . Anemia   . Cancer ([place])    Right breast  . CKD (chronic kidney disease)   . Diabetes mellitus without complication (HCC)    DM2, A1c 6.6% 06/29/19 at [facility]  . Family history of prostate cancer   . GERD (gastroesophageal reflux disease)   . Headache    pt. relates this to stress somewhat   . Hypertension   . Lupus (systemic lupus erythematosus) ([place])   . Rheumatoid arthritis (HCC)    RA, followed by Dr. Scarlette Shorts in [place] for Lupus & RA    Past Surgical History:  Procedure Laterality Date  . BREAST LUMPECTOMY WITH RADIOACTIVE SEED AND SENTINEL LYMPH NODE BIOPSY Right 07/02/2019   Procedure: RIGHT BREAST LUMPECTOMY WITH RADIOACTIVE SEED AND SENTINEL LYMPH NODE BIOPSY;  Surgeon: [name], MD;  Location: [place];  Service: General;  Laterality: Right;  . CHOLECYSTECTOMY    . FRACTURE SURGERY Left 2010   pin removed from hand   . TUBAL LIGATION    . VAGINAL DELIVERY     x2    Social History   Socioeconomic History  . Marital status: Married  Spouse name: Not on file  . Number of children: Not on file  . Years of education: Not on file  . Highest education level: Not on file  Occupational History  . Not on file  Social Needs  . Financial resource strain: Not on file  . Food insecurity    Worry: Not on file    Inability: Not on file  . Transportation needs    Medical: No    Non-medical: No  Tobacco Use  . Smoking status: Current Every Day Smoker    Packs/day: 0.50    Types: Cigarettes  . Smokeless tobacco: Never Used  Substance and Sexual Activity  . Alcohol use: Yes    Alcohol/week: 2.0 standard  drinks    Types: 2 Glasses of wine per week    Frequency: Never    Comment: wine or vodka & coke-   . Drug use: Never  . Sexual activity: Yes    Birth control/protection: None  Lifestyle  . Physical activity    Days per week: Not on file    Minutes per session: Not on file  . Stress: Not on file  Relationships  . Social Herbalist on phone: Not on file    Gets together: Not on file    Attends religious service: Not on file    Active member of club or organization: Not on file    Attends meetings of clubs or organizations: Not on file    Relationship status: Not on file  Other Topics Concern  . Not on file  Social History Narrative  . Not on file     FAMILY HISTORY:  We obtained a detailed, 4-generation family history.  Significant diagnoses are listed below: Family History  Problem Relation Age of Onset  . Lupus Mother        d. 35  . Throat cancer Father 44       d. 21  . Lupus Sister   . Heart disease Maternal Grandmother   . Alcohol abuse Maternal Grandfather        d. 9  . Asthma Paternal Grandmother   . Prostate cancer Paternal Grandfather        d. 13s  . Thyroid disease Sister   . Cancer Cousin        unknown cancer, d. 17    The patient has a son and daughter who are cancer free.  She has a brother and two sisters who do not have cancer.  Both parents are deceased.  The patient's father died of throat cancer at 36.  He had two brothers and a sister who are call cancer free.  The sister had a son who had an unknown cancer.  The paternal grandparents are deceased. The grandfather had prostate cancer and the grandmother died of an asthma attack.  The patients mother died of lupus at age 70.  She had two sisters and a brother  Who were cancer free.  The maternal grandparents are deceased.  The grandfather died of alcohol abuse and the grandmother died of heart problems.    Sandra Patterson is unaware of previous family history of genetic testing for  hereditary cancer risks. Patient's maternal ancestors are of African American descent, and paternal ancestors are of African American descent. There is no reported Ashkenazi Jewish ancestry. There is no known consanguinity.  GENETIC COUNSELING ASSESSMENT: Sandra Patterson is a 48 y.o. female with a personal and family history of cancer which is somewhat suggestive  of a hereditary cancer syndrome and predisposition to cancer given her young age of breast cancer. We, therefore, discussed and recommended the following at today's visit.   DISCUSSION: We discussed that 5 - 10% of breast cancer is hereditary, with most cases associated with BRCA mutations.  There are other genes that can be associated with hereditary breast cancer syndromes.  These include CDH1, specially related to the lobular form of breast cancer.  We discussed that testing is beneficial for several reasons including knowing how to follow individuals after completing their treatment, identifying whether potential treatment options such as PARP inhibitors would be beneficial, and understand if other family members could be at risk for cancer and allow them to undergo genetic testing.   We reviewed the characteristics, features and inheritance patterns of hereditary cancer syndromes. We also discussed genetic testing, including the appropriate family members to test, the process of testing, insurance coverage and turn-around-time for results. We discussed the implications of a negative, positive, carrier and/or variant of uncertain significant result. We recommended Sandra Patterson pursue genetic testing for the CustomNext-Cancer+RNA gene panel.The CustomNext-Cancer gene panel offered by [facility] and includes sequencing and rearrangement analysis for the following 91 genes: AIP, ALK, APC*, ATM*, AXIN2, BAP1, BARD1, BLM, BMPR1A, BRCA1*, BRCA2*, BRIP1*, CDC73, CDH1*, CDK4, CDKN1B, CDKN2A, CHEK2*, CTNNA1, DICER1, FANCC, FH, FLCN, GALNT12, KIF1B, LZTR1,  MAX, MEN1, MET, MLH1*, MRE11A, MSH2*, MSH3, MSH6*, MUTYH*, NBN, NF1*, NF2, NTHL1, PALB2*, PHOX2B, PMS2*, POT1, PRKAR1A, PTCH1, PTEN*, RAD50, RAD51C*, RAD51D*, RB1, RECQL, RET, SDHA, SDHAF2, SDHB, SDHC, SDHD, SMAD4, SMARCA4, SMARCB1, SMARCE1, STK11, SUFU, TMEM127, TP53*, TSC1, TSC2, VHL and XRCC2 (sequencing and deletion/duplication); CASR, CFTR, CPA1, CTRC, EGFR, EGLN1, FAM175A, HOXB13, KIT, MITF, MLH3, PALLD, PDGFRA, POLD1, POLE, PRSS1, RINT1, RPS20, SPINK1 and TERT (sequencing only); EPCAM and GREM1 (deletion/duplication only). DNA and RNA analyses performed for * genes.   Based on Sandra Patterson personal and family history of cancer, she meets medical criteria for genetic testing. Despite that she meets criteria, she may still have an out of pocket cost. We discussed that if her out of pocket cost for testing is over $100, the laboratory will call and confirm whether she wants to proceed with testing.  If the out of pocket cost of testing is less than $100 she will be billed by the genetic testing laboratory.   PLAN: After considering the risks, benefits, and limitations, Sandra Patterson provided informed consent to pursue genetic testing and the blood sample was sent to [facility] for analysis of the CustomNext-Cancer+RNA test. Results should be available within approximately 2-3 weeks' time, at which point they will be disclosed by telephone to Sandra Patterson, as will any additional recommendations warranted by these results. Sandra Patterson will receive a summary of her genetic counseling visit and a copy of her results once available. This information will also be available in Epic.   Lastly, we encouraged Sandra Patterson to remain in contact with cancer genetics annually so that we can continuously update the family history and inform her of any changes in cancer genetics and testing that may be of benefit for this family.   Sandra Patterson questions were answered to her satisfaction today. Our contact  information was provided should additional questions or concerns arise. Thank you for the referral and allowing [place] to share in the care of your patient.   Karen P. Florene Glen, [place], [facility] Licensed, Insurance risk surveyor [email or web address] phone: [phone]  The patient was seen for a total of 45 minutes in face-to-face genetic  counseling.  This patient was discussed with Drs. Magrinat, Lindi Adie and/or Burr Medico who agrees with the above.    _______________________________________________________________________ For Office Staff:  Number of people involved in session: 1 Was an Intern/ student involved with case: yes: Amy Danelle Earthly

## 2019-07-16 ENCOUNTER — Telehealth: Payer: Self-pay | Admitting: *Deleted

## 2019-07-16 NOTE — Telephone Encounter (Signed)
Received oncotype results of 21/7%.  Confirmed appointment for 07/21/19 at 9:45am to discuss with Dr. Lindi Adie.

## 2019-07-20 NOTE — Progress Notes (Signed)
Patient Care Team: [name], FNP as PCP - General (Family Medicine)  DIAGNOSIS:    ICD-10-CM   1. Malignant neoplasm of upper-outer quadrant of right breast in female, estrogen receptor positive ([place])  C50.411    Z17.0     SUMMARY OF ONCOLOGIC HISTORY: Oncology History  Malignant neoplasm of upper-outer quadrant of right breast in female, estrogen receptor positive ([place])  06/23/2019 Initial Diagnosis   Routine screening mammogram detected an 0.8cm mass in the right breast at the 9:00 position 1 cm from the nipple. 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    06/23/2019 Cancer Staging   Staging form: Breast, AJCC 8th Edition - Clinical stage from 06/23/2019: Stage IA (cT1b, cN0, cM0, G2, ER+, PR+, HER2-) - Signed by [name], NP on 06/30/2019   07/02/2019 Surgery   Right lumpectomy (Tsuei): invasive lobular carcinoma with LCIS, 1.4cm, grade 2, clear margins, and isolated tumor cells present in 1 of 2 lymph nodes.    07/02/2019 Cancer Staging   Staging form: Breast, AJCC 8th Edition - Pathologic stage from 07/02/2019: Stage IA (pT1c, pN0(i+), cM0, G2, ER+, PR+, HER2-) - Signed by [name], MD on 07/12/2019   07/16/2019 Oncotype testing   HIGH risk, score of 21/7%     CHIEF COMPLIANT: Follow-up to discuss Oncotype results  INTERVAL HISTORY: Sandra Patterson is a 48 y.o. with above-mentioned history of right breast cancer who underwent a lumpectomy. Oncotype testing showed she was high risk, with a score of 21 and 7% chance of recurrence in 9 years without systemic treatment. She presents to the clinic today to discuss her results and further treatment.    REVIEW OF SYSTEMS:   Constitutional: Denies fevers, chills or abnormal weight loss Eyes: Denies blurriness of vision Ears, nose, mouth, throat, and face: Denies mucositis or sore throat Respiratory: Denies cough, dyspnea or wheezes Cardiovascular: Denies palpitation,  chest discomfort Gastrointestinal: Denies nausea, heartburn or change in bowel habits Skin: Denies abnormal skin rashes Lymphatics: Denies new lymphadenopathy or easy bruising Neurological: Denies numbness, tingling or new weaknesses Behavioral/Psych: Mood is stable, no new changes  Extremities: No lower extremity edema Breast: denies any pain or lumps or nodules in either breasts All other systems were reviewed with the patient and are negative.  I have reviewed the past medical history, past surgical history, social history and family history with the patient and they are unchanged from previous note.  ALLERGIES:  is allergic to codeine.  MEDICATIONS:  Current Outpatient Medications  Medication Sig Dispense Refill  . acetaminophen (TYLENOL) 500 MG tablet Take 1,000 mg by mouth every 6 (six) hours as needed.    Marland Kitchen amLODipine (NORVASC) 5 MG tablet Take 5 mg by mouth daily.    Marland Kitchen atorvastatin (LIPITOR) 40 MG tablet Take 40 mg by mouth daily.    . benazepril (LOTENSIN) 40 MG tablet Take 40 mg by mouth 2 (two) times daily.     . carvedilol (COREG) 25 MG tablet Take 25 mg by mouth 2 (two) times daily with a meal.    . famotidine (PEPCID) 20 MG tablet Take 20 mg by mouth daily.     . ferrous sulfate 325 (65 FE) MG tablet Take 325 mg by mouth 2 (two) times daily.     . hydroxychloroquine (PLAQUENIL) 200 MG tablet Take 200 mg by mouth 2 (two) times daily.     . Vitamin D, Ergocalciferol, (DRISDOL) 1.25 MG (50000 UT) CAPS capsule Take 50,000  Units by mouth every Friday.      No current facility-administered medications for this visit.     PHYSICAL EXAMINATION: ECOG PERFORMANCE STATUS: 1 - Symptomatic but completely ambulatory  Vitals:   07/21/19 1031  BP: (!) 156/89  Pulse: 77  Resp: 18  Temp: 98.5 F (36.9 C)  SpO2: 98%   Filed Weights   07/21/19 1031  Weight: 291 lb 8 oz (132.2 kg)    GENERAL: alert, no distress and comfortable SKIN: skin color, texture, turgor are normal, no  rashes or significant lesions EYES: normal, Conjunctiva are pink and non-injected, sclera clear OROPHARYNX: no exudate, no erythema and lips, buccal mucosa, and tongue normal  NECK: supple, thyroid normal size, non-tender, without nodularity LYMPH: no palpable lymphadenopathy in the cervical, axillary or inguinal LUNGS: clear to auscultation and percussion with normal breathing effort HEART: regular rate & rhythm and no murmurs and no lower extremity edema ABDOMEN: abdomen soft, non-tender and normal bowel sounds MUSCULOSKELETAL: no cyanosis of digits and no clubbing  NEURO: alert & oriented x 3 with fluent speech, no focal motor/sensory deficits EXTREMITIES: No lower extremity edema  LABORATORY DATA:  I have reviewed the data as listed CMP Latest Ref Rng & Units 06/29/2019  Glucose 70 - 99 mg/dL 124(H)  BUN 6 - 20 mg/dL 9  Creatinine 0.44 - 1.00 mg/dL 1.53(H)  Sodium 135 - 145 mmol/L 135  Potassium 3.5 - 5.1 mmol/L 3.4(L)  Chloride 98 - 111 mmol/L 101  CO2 22 - 32 mmol/L 24  Calcium 8.9 - 10.3 mg/dL 9.8    Lab Results  Component Value Date   WBC 7.2 06/29/2019   HGB 15.5 (H) 06/29/2019   HCT 47.5 (H) 06/29/2019   MCV 95.6 06/29/2019   PLT 175 06/29/2019    ASSESSMENT & PLAN:  Malignant neoplasm of upper-outer quadrant of right breast in female, estrogen receptor positive ([place]) 06/23/2019:Routine screening mammogram detected an 0.8cm mass in the right breast at the 9:00 position 1 cm from the nipple. 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 T1CN0 stage Ia clinical stage History of rheumatoid arthritis and lupus and hypertension  Treatment plan: 1. Breast conserving surgery:Right lumpectomy (Tsuei): invasive lobular carcinoma with LCIS, 1.4cm, grade 2, clear margins, and isolated tumor cells present in 1 of 2 lymph nodes. 2. Oncotype DX testing: Recurrence score 21, recommended chemotherapy but patient refused because of small benefit 3.  Adjuvant radiation therapy followed by 4. Adjuvant antiestrogen therapy ---------------------------------------------------------------------------------------------------------------- Staging: T1c N0 stage Ia Oncotype DX score: 21: 7% risk of distant recurrence with hormone therapy alone Based on the tailor Rx data, chemotherapy is indicated.  However the benefits of chemotherapy are relatively small. I discussed the risks and benefits of chemotherapy with Taxotere and Cytoxan every 3 weeks for 4 cycles as a treatment option.  After extensive discussion, patient decided that she does not want to go through chemotherapy. She will be seen after radiation therapy to begin antiestrogen therapy.   No orders of the defined types were placed in this encounter.  The patient has a good understanding of the overall plan. she agrees with it. she will call with any problems that may develop before the next visit here.  [name], MD 07/21/2019  Julious Oka Dorshimer am acting as scribe for Dr. [name].  I have reviewed the above documentation for accuracy and completeness, and I agree with the above.

## 2019-07-21 ENCOUNTER — Telehealth: Payer: Self-pay | Admitting: Radiation Oncology

## 2019-07-21 ENCOUNTER — Inpatient Hospital Stay (HOSPITAL_BASED_OUTPATIENT_CLINIC_OR_DEPARTMENT_OTHER): Admitting: Hematology and Oncology

## 2019-07-21 ENCOUNTER — Other Ambulatory Visit: Payer: Self-pay

## 2019-07-21 DIAGNOSIS — E785 Hyperlipidemia, unspecified: Secondary | ICD-10-CM | POA: Diagnosis not present

## 2019-07-21 DIAGNOSIS — Z170 Estrogen receptor positive status [ER+]: Secondary | ICD-10-CM

## 2019-07-21 DIAGNOSIS — I10 Essential (primary) hypertension: Secondary | ICD-10-CM | POA: Diagnosis not present

## 2019-07-21 DIAGNOSIS — C50411 Malignant neoplasm of upper-outer quadrant of right female breast: Secondary | ICD-10-CM

## 2019-07-21 DIAGNOSIS — Z79899 Other long term (current) drug therapy: Secondary | ICD-10-CM | POA: Diagnosis not present

## 2019-07-21 NOTE — Telephone Encounter (Signed)
New message: ° ° °LVM for patient to return call to schedule from referral received. °

## 2019-07-21 NOTE — Assessment & Plan Note (Signed)
06/23/2019:Routine screening mammogram detected an 0.8cm mass in the right breast at the 9:00 position 1 cm from the nipple. 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 T1CN0 stage Ia clinical stage History of rheumatoid arthritis and lupus and hypertension  Treatment plan: 1. Breast conserving surgery followed by 2. Oncotype DX testing to determine if chemotherapy would be of any benefit followed by 3. Adjuvant radiation therapy followed by 4. Adjuvant antiestrogen therapy ---------------------------------------------------------------------------------------------------------------- Right lumpectomy:Right lumpectomy (Tsuei): invasive lobular carcinoma with LCIS, 1.4cm, grade 2, clear margins, and isolated tumor cells present in 1 of 2 lymph nodes. T1c N0 stage Ia Oncotype DX score: 21: 7% risk of distant recurrence with hormone therapy alone Based on the tailor Rx data, chemotherapy is indicated.  However the benefits of chemotherapy are relatively small. I discussed the risks and benefits of chemotherapy with Taxotere and Cytoxan every 3 weeks for 4 cycles as a treatment option.

## 2019-07-23 ENCOUNTER — Encounter: Payer: Self-pay | Admitting: Hematology and Oncology

## 2019-07-26 ENCOUNTER — Encounter: Payer: Self-pay | Admitting: *Deleted

## 2019-07-27 ENCOUNTER — Telehealth: Payer: Self-pay | Admitting: Radiation Oncology

## 2019-07-27 NOTE — Telephone Encounter (Signed)
Left message for patient to verify visit for pre reg

## 2019-07-28 ENCOUNTER — Ambulatory Visit
Admission: RE | Admit: 2019-07-28 | Discharge: 2019-07-28 | Disposition: A | Discharge status: Home / Self Care | Source: Ambulatory Visit | Attending: Radiation Oncology | Admitting: Radiation Oncology

## 2019-07-28 ENCOUNTER — Encounter: Payer: Self-pay | Admitting: Radiation Oncology

## 2019-07-28 ENCOUNTER — Other Ambulatory Visit: Payer: Self-pay

## 2019-07-28 VITALS — Ht 69.0 in | Wt 291.0 lb

## 2019-07-28 DIAGNOSIS — Z170 Estrogen receptor positive status [ER+]: Secondary | ICD-10-CM

## 2019-07-28 DIAGNOSIS — C50411 Malignant neoplasm of upper-outer quadrant of right female breast: Secondary | ICD-10-CM

## 2019-07-28 NOTE — Progress Notes (Signed)
Radiation Oncology         [phone]) [phone] ________________________________  Outpatient Follow Up - Conducted via telephone due to current COVID-19 concerns for limiting patient exposure  I spoke with the patient to conduct this consult visit via telephone to spare the patient unnecessary potential exposure in the healthcare setting during the current COVID-19 pandemic. The patient was notified in advance and was offered a [facility] meeting to allow for face to face communication but unfortunately reported that they did not have the appropriate resources/technology to support such a visit and instead preferred to proceed with a telephone visit.  ________________________________  Name: Sandra Patterson        MRN: [id number]  Date of Service: 07/28/2019 DOB: 1971-07-06  DJ:[id number], Jarome Matin, FNP  [name], MD     REFERRING PHYSICIAN: [name], MD   DIAGNOSIS: The encounter diagnosis was Malignant neoplasm of upper-outer quadrant of right breast in female, estrogen receptor positive ([place]).   HISTORY OF PRESENT ILLNESS: Sandra Patterson is a 48 y.o. female with a new diagnosis of right breast cancer. The patient was noted to have a screening detected right breast mass when she went for routine mammography in [place], [place] where she resides. Diagnostic imaging is not available for review but per Dr. [name], she had an 8 mm lesion in the right breast at 9:00 and her axilla was clinically negative. 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 Her pathology was rereviewed and revealed a grade 2 ILC that was ER/PR positive, HER2 negative with a Ki 67 of <10%. She underwent right lumpectomy with sentinel node biopsy on 07/02/2019 and this revealed a 1.4 cm grade 2 invasive ductal carcinoma with clear margins and 2 sampled nodes were evaluated and one contained isolated tumor cells. She did have a high risk oncotype  score of 21, and was offered systemic chemotherapy but declined. She is contacted today by phone to discuss adjuvant radiotherapy.   PREVIOUS RADIATION THERAPY: No   PAST MEDICAL HISTORY:  Past Medical History:  Diagnosis Date   Anemia    Cancer ([place])    Right breast   CKD (chronic kidney disease)    Diabetes mellitus without complication (HCC)    DM2, A1c 6.6% 06/29/19 at [facility]   Family history of prostate cancer    GERD (gastroesophageal reflux disease)    Headache    pt. relates this to stress somewhat    Hypertension    Lupus (systemic lupus erythematosus) ([place])    Rheumatoid arthritis (HCC)    RA, followed by Dr. Scarlette Shorts in [place] for Lupus & RA       PAST SURGICAL HISTORY: Past Surgical History:  Procedure Laterality Date   BREAST LUMPECTOMY WITH RADIOACTIVE SEED AND SENTINEL LYMPH NODE BIOPSY Right 07/02/2019   Procedure: RIGHT BREAST LUMPECTOMY WITH RADIOACTIVE SEED AND SENTINEL LYMPH NODE BIOPSY;  Surgeon: [name], MD;  Location: [facility];  Service: General;  Laterality: Right;   CHOLECYSTECTOMY     FRACTURE SURGERY Left 2010   pin removed from hand    TUBAL LIGATION     VAGINAL DELIVERY     x2     FAMILY HISTORY:  Family History  Problem Relation Age of Onset   Lupus Mother        d. 46   Throat cancer Father 35       d. 68   Lupus Sister  Heart disease Maternal Grandmother    Alcohol abuse Maternal Grandfather        d. 70   Asthma Paternal Grandmother    Prostate cancer Paternal Grandfather        d. 71s   Thyroid disease Sister    Cancer Cousin        unknown cancer, d. 9     SOCIAL HISTORY:  reports that she has been smoking cigarettes. She has been smoking about 0.50 packs per day. She has never used smokeless tobacco. She reports current alcohol use of about 2.0 standard drinks of alcohol per week. She reports that she does not use drugs. The patient is unemployed as a result of layoffs from [employer]. She has three  adult children and lives in [place], [place].     ALLERGIES: Codeine   MEDICATIONS:  Current Outpatient Medications  Medication Sig Dispense Refill   acetaminophen (TYLENOL) 500 MG tablet Take 1,000 mg by mouth every 6 (six) hours as needed.     amLODipine (NORVASC) 5 MG tablet Take 5 mg by mouth daily.     atorvastatin (LIPITOR) 40 MG tablet Take 40 mg by mouth daily.     benazepril (LOTENSIN) 40 MG tablet Take 40 mg by mouth 2 (two) times daily.      carvedilol (COREG) 25 MG tablet Take 25 mg by mouth 2 (two) times daily with a meal.     famotidine (PEPCID) 20 MG tablet Take 20 mg by mouth daily.      ferrous sulfate 325 (65 FE) MG tablet Take 325 mg by mouth 2 (two) times daily.      hydroxychloroquine (PLAQUENIL) 200 MG tablet Take 200 mg by mouth 2 (two) times daily.      Vitamin D, Ergocalciferol, (DRISDOL) 1.25 MG (50000 UT) CAPS capsule Take 50,000 Units by mouth every Friday.      No current facility-administered medications for this encounter.      REVIEW OF SYSTEMS: On review of systems, the patient reports that she is doing well overall. She denies any chest pain, shortness of breath, cough, fevers, chills, night sweats, unintended weight changes. She denies any bowel or bladder disturbances, and denies abdominal pain, nausea or vomiting. She denies any new musculoskeletal or joint aches or pains. A complete review of systems is obtained and is otherwise negative.     PHYSICAL EXAM:  Wt Readings from Last 3 Encounters:  07/21/19 291 lb 8 oz (132.2 kg)  07/12/19 290 lb 4.8 oz (131.7 kg)  07/02/19 292 lb (132.5 kg)   Unable to assess due to nature of encounter   ECOG = 0  0 - Asymptomatic (Fully active, able to carry on all predisease activities without restriction)  1 - Symptomatic but completely ambulatory (Restricted in physically strenuous activity but ambulatory and able to carry out work of a light or sedentary nature. For example, light housework, office  work)  2 - Symptomatic, <50% in bed during the day (Ambulatory and capable of all self care but unable to carry out any work activities. Up and about more than 50% of waking hours)  3 - Symptomatic, >50% in bed, but not bedbound (Capable of only limited self-care, confined to bed or chair 50% or more of waking hours)  4 - Bedbound (Completely disabled. Cannot carry on any self-care. Totally confined to bed or chair)  5 - Death   Eustace Pen MM, Creech RH, Tormey DC, et al. [phone]). "Toxicity and response criteria of the [employer]  [employer] Group". [place] Oncol. 5 (6): 649-55    LABORATORY DATA:  Lab Results  Component Value Date   WBC 7.2 06/29/2019   HGB 15.5 (H) 06/29/2019   HCT 47.5 (H) 06/29/2019   MCV 95.6 06/29/2019   PLT 175 06/29/2019   Lab Results  Component Value Date   NA 135 06/29/2019   K 3.4 (L) 06/29/2019   CL 101 06/29/2019   CO2 24 06/29/2019   No results found for: ALT, AST, GGT, ALKPHOS, BILITOT    RADIOGRAPHY: Nm Sentinel Node Inj-no Rpt (breast)  Result Date: 07/02/2019 Sulfur colloid was injected by the nuclear medicine technologist for melanoma sentinel node.   Mm Breast Surgical Specimen  Result Date: 07/02/2019 CLINICAL DATA:  48 year old patient had a radioactive seed localization of a biopsy-proven malignancy in right breast. EXAM: SPECIMEN RADIOGRAPH OF THE RIGHT BREAST COMPARISON:  Previous exam(s). FINDINGS: Status post excision of the right breast. The radioactive seed and biopsy marker clip are present, completely intact, and were marked for pathology. IMPRESSION: Specimen radiograph of the right breast. Electronically Signed   By: Curlene Dolphin M.D.   On: 07/02/2019 08:22   Mm Rt Radioactive Seed Loc Mammo Guide  Result Date: 07/01/2019 CLINICAL DATA:  Patient for preoperative localization prior to right lumpectomy. EXAM: MAMMOGRAPHIC GUIDED RADIOACTIVE SEED LOCALIZATION OF THE RIGHT BREAST COMPARISON:  Previous exam(s). FINDINGS:  Patient presents for radioactive seed localization prior to right lumpectomy. I met with the patient and we discussed the procedure of seed localization including benefits and alternatives. We discussed the high likelihood of a successful procedure. We discussed the risks of the procedure including infection, bleeding, tissue injury and further surgery. We discussed the low dose of radioactivity involved in the procedure. Informed, written consent was given. The usual time-out protocol was performed immediately prior to the procedure. Using mammographic guidance, sterile technique, 1% lidocaine and an I-125 radioactive seed, mass and biopsy marking clip was localized using a lateral approach. The follow-up mammogram images confirm the seed in the expected location and were marked for Dr. [name]. Follow-up survey of the patient confirms presence of the radioactive seed. Order number of I-125 seed:  [id number]. Total activity:  2.353 millicuries reference Date: 06/11/2019 The patient tolerated the procedure well and was released from the [facility]. She was given instructions regarding seed removal. IMPRESSION: Radioactive seed localization right breast. No apparent complications. Electronically Signed   By: Lovey Newcomer M.D.   On: 07/01/2019 16:27       IMPRESSION/PLAN: 1. Stage IA, pT1cN0M0(i+) grade 2, ER/PR positive invasive lobular carcinoma of the right breast. Dr. Lisbeth Renshaw discusses the final results of pathology and reviews the nature of right breast disease. She was offered chemotherapy but declined. She would benefit from external radiotherapy to the breast followed by antiestrogen therapy. We discussed the risks, benefits, short, and long term effects of radiotherapy, and the patient is interested in proceeding. Dr. Lisbeth Renshaw discusses the delivery and logistics of radiotherapy and anticipates a course of 6 1/2 weeks of radiotherapy with high tangents into the axilla. She is scheduled for simulation on Friday  this week and will sign written consent at that time.  2. Rheumatologic conditions. The patient has mild lupus per report and takes Plaquenil for RA. She continues to follow with Dr. Scarlette Shorts in [place]. We reviewed that we would follow her closely along during treatment and she is aware that these conditions can flare in the midst of radiotherapy but would not be prohibitive to  consider radiotherapy in her situation. She is also aware that steroids would be used if she had a flare during radiotherapy.   Given current concerns for patient exposure during the COVID-19 pandemic, this encounter was conducted via telephone.  The patient has given verbal consent for this type of encounter. The time spent during this encounter was 25 minutes and 50% of that time was spent in the coordination of her care. The attendants for this meeting included Dr. Lisbeth Renshaw, London Pepper, RN, Shona Simpson, [facility] and Jordan Likes  During the encounter, Dr. Lisbeth Renshaw, London Pepper, RN, and Shona Simpson [facility] were located at [facility] Radiation Oncology Department.  Sung Renton  was located at home.   The above documentation reflects my direct findings during this shared patient visit. Please see the separate note by Dr. Lisbeth Renshaw on this date for the remainder of the patient's plan of care.    Carola Rhine, PAC

## 2019-07-28 NOTE — Progress Notes (Signed)
See progress note under physician encounter. 

## 2019-07-30 ENCOUNTER — Other Ambulatory Visit: Payer: Self-pay

## 2019-07-30 ENCOUNTER — Ambulatory Visit
Admission: RE | Admit: 2019-07-30 | Discharge: 2019-07-30 | Disposition: A | Discharge status: Home / Self Care | Source: Ambulatory Visit | Attending: Radiation Oncology | Admitting: Radiation Oncology

## 2019-07-30 DIAGNOSIS — Z170 Estrogen receptor positive status [ER+]: Secondary | ICD-10-CM | POA: Insufficient documentation

## 2019-07-30 DIAGNOSIS — C50411 Malignant neoplasm of upper-outer quadrant of right female breast: Secondary | ICD-10-CM | POA: Diagnosis present

## 2019-08-02 ENCOUNTER — Encounter: Payer: Self-pay | Admitting: *Deleted

## 2019-08-02 NOTE — Progress Notes (Signed)
  Radiation Oncology         [phone] ________________________________  Name: Keydra Osorno MRN: [id number]  Date: 07/30/2019  DOB: 08-08-1971  DIAGNOSIS:     ICD-10-CM   1. Malignant neoplasm of upper-outer quadrant of right breast in female, estrogen receptor positive ([place])  C50.411    Z17.0      SIMULATION AND TREATMENT PLANNING NOTE  The patient presented for simulation prior to beginning her course of radiation treatment for her diagnosis of right-sided breast cancer. The patient was placed in a supine position on a breast board. A customized vac-lock bag was constructed and this complex treatment device will be used on a daily basis during her treatment. In this fashion, a CT scan was obtained through the chest area and an isocenter was placed near the chest wall within the breast.  The patient will be planned to receive a course of radiation initially to a dose of 50.4 Gy. This will consist of a whole breast radiotherapy technique. To accomplish this, 2 customized blocks have been designed which will correspond to medial and lateral whole breast tangent fields. This treatment will be accomplished at 1.8 Gy per fraction. A forward planning technique will also be evaluated to determine if this approach improves the plan. It is anticipated that the patient will then receive a 10 Gy boost to the seroma cavity which has been contoured. This will be accomplished at 2 Gy per fraction.   This initial treatment will consist of a 3-D conformal technique. The seroma has been contoured as the primary target structure. Additionally, dose volume histograms of both this target as well as the lungs and heart will also be evaluated. Such an approach is necessary to ensure that the target area is adequately covered while the nearby critical  normal structures are adequately spared.   Plan:  The final anticipated total dose therefore will correspond to 60.4 Gy.    _______________________________   Jodelle Gross, MD, PhD

## 2019-08-02 NOTE — Progress Notes (Signed)
  Radiation Oncology         [phone] ________________________________  Name: Sandra Patterson MRN: [id number]  Date: 07/30/2019  DOB: Mar 21, 1971  Optical Surface Tracking Plan:  Since intensity modulated radiotherapy (IMRT) and 3D conformal radiation treatment methods are predicated on accurate and precise positioning for treatment, intrafraction motion monitoring is medically necessary to ensure accurate and safe treatment delivery.  The ability to quantify intrafraction motion without excessive ionizing radiation dose can only be performed with optical surface tracking. Accordingly, surface imaging offers the opportunity to obtain 3D measurements of patient position throughout IMRT and 3D treatments without excessive radiation exposure.  I am ordering optical surface tracking for this patient's upcoming course of radiotherapy. ________________________________  [name], MD 08/02/2019 9:12 AM    Reference:   Particia Jasper, et al. Surface imaging-based analysis of intrafraction motion for breast radiotherapy patients.Journal of [place], n. 6, nov. 2014. ISSN [id number].   Available at: <http://www.jacmp.org/index.php/jacmp/article/view/4957>.

## 2019-08-03 ENCOUNTER — Telehealth: Payer: Self-pay | Admitting: Hematology and Oncology

## 2019-08-03 NOTE — Telephone Encounter (Signed)
Scheduled appt per 9/21 sch message - pt aware of appt date and time   

## 2019-08-05 DIAGNOSIS — C50411 Malignant neoplasm of upper-outer quadrant of right female breast: Secondary | ICD-10-CM | POA: Diagnosis not present

## 2019-08-09 ENCOUNTER — Other Ambulatory Visit: Payer: Self-pay

## 2019-08-09 ENCOUNTER — Ambulatory Visit
Admission: RE | Admit: 2019-08-09 | Discharge: 2019-08-09 | Disposition: A | Discharge status: Home / Self Care | Source: Ambulatory Visit | Attending: Radiation Oncology | Admitting: Radiation Oncology

## 2019-08-09 DIAGNOSIS — C50411 Malignant neoplasm of upper-outer quadrant of right female breast: Secondary | ICD-10-CM | POA: Diagnosis not present

## 2019-08-10 ENCOUNTER — Ambulatory Visit
Admission: RE | Admit: 2019-08-10 | Discharge: 2019-08-10 | Disposition: A | Discharge status: Home / Self Care | Source: Ambulatory Visit | Attending: Radiation Oncology | Admitting: Radiation Oncology

## 2019-08-10 ENCOUNTER — Other Ambulatory Visit: Payer: Self-pay

## 2019-08-10 DIAGNOSIS — C50411 Malignant neoplasm of upper-outer quadrant of right female breast: Secondary | ICD-10-CM | POA: Diagnosis not present

## 2019-08-11 ENCOUNTER — Ambulatory Visit
Admission: RE | Admit: 2019-08-11 | Discharge: 2019-08-11 | Disposition: A | Discharge status: Home / Self Care | Source: Ambulatory Visit | Attending: Radiation Oncology | Admitting: Radiation Oncology

## 2019-08-11 ENCOUNTER — Other Ambulatory Visit: Payer: Self-pay

## 2019-08-11 DIAGNOSIS — Z170 Estrogen receptor positive status [ER+]: Secondary | ICD-10-CM

## 2019-08-11 DIAGNOSIS — C50411 Malignant neoplasm of upper-outer quadrant of right female breast: Secondary | ICD-10-CM | POA: Diagnosis not present

## 2019-08-11 MED ORDER — ALRA NON-METALLIC DEODORANT (RAD-ONC)
1.0000 "application " | Freq: Once | TOPICAL | Status: AC
  Administered 2019-08-11: 1 via TOPICAL

## 2019-08-11 MED ORDER — RADIAPLEXRX EX GEL
Freq: Once | CUTANEOUS | Status: AC
  Administered 2019-08-11: 17:00:00 via TOPICAL

## 2019-08-11 NOTE — Progress Notes (Signed)
Pt here for patient teaching.  Pt given Radiation and You booklet, skin care instructions, Alra deodorant and Radiaplex gel.  Reviewed areas of pertinence such as fatigue, hair loss, skin changes, breast tenderness and breast swelling . Pt able to give teach back of to pat skin and use unscented/gentle soap,apply Radiaplex bid, avoid applying anything to skin within 4 hours of treatment, avoid wearing an under wire bra and to use an electric razor if they must shave. Pt verbalizes understanding of information given and will contact nursing with any questions or concerns.     Temeca Somma M. Karina Nofsinger RN, BSN      

## 2019-08-12 ENCOUNTER — Other Ambulatory Visit: Payer: Self-pay

## 2019-08-12 ENCOUNTER — Ambulatory Visit
Admission: RE | Admit: 2019-08-12 | Discharge: 2019-08-12 | Disposition: A | Discharge status: Home / Self Care | Source: Ambulatory Visit | Attending: Radiation Oncology | Admitting: Radiation Oncology

## 2019-08-12 DIAGNOSIS — C50411 Malignant neoplasm of upper-outer quadrant of right female breast: Secondary | ICD-10-CM | POA: Insufficient documentation

## 2019-08-12 DIAGNOSIS — Z170 Estrogen receptor positive status [ER+]: Secondary | ICD-10-CM | POA: Diagnosis present

## 2019-08-13 ENCOUNTER — Ambulatory Visit
Admission: RE | Admit: 2019-08-13 | Discharge: 2019-08-13 | Disposition: A | Discharge status: Home / Self Care | Source: Ambulatory Visit | Attending: Radiation Oncology | Admitting: Radiation Oncology

## 2019-08-13 ENCOUNTER — Other Ambulatory Visit: Payer: Self-pay

## 2019-08-13 DIAGNOSIS — C50411 Malignant neoplasm of upper-outer quadrant of right female breast: Secondary | ICD-10-CM | POA: Diagnosis not present

## 2019-08-16 ENCOUNTER — Other Ambulatory Visit: Payer: Self-pay

## 2019-08-16 ENCOUNTER — Ambulatory Visit
Admission: RE | Admit: 2019-08-16 | Discharge: 2019-08-16 | Disposition: A | Discharge status: Home / Self Care | Source: Ambulatory Visit | Attending: Radiation Oncology | Admitting: Radiation Oncology

## 2019-08-16 DIAGNOSIS — C50411 Malignant neoplasm of upper-outer quadrant of right female breast: Secondary | ICD-10-CM | POA: Diagnosis not present

## 2019-08-17 ENCOUNTER — Ambulatory Visit
Admission: RE | Admit: 2019-08-17 | Discharge: 2019-08-17 | Disposition: A | Discharge status: Home / Self Care | Source: Ambulatory Visit | Attending: Radiation Oncology | Admitting: Radiation Oncology

## 2019-08-17 ENCOUNTER — Other Ambulatory Visit: Payer: Self-pay

## 2019-08-17 DIAGNOSIS — C50411 Malignant neoplasm of upper-outer quadrant of right female breast: Secondary | ICD-10-CM | POA: Diagnosis not present

## 2019-08-18 ENCOUNTER — Ambulatory Visit

## 2019-08-19 ENCOUNTER — Ambulatory Visit
Admission: RE | Admit: 2019-08-19 | Discharge: 2019-08-19 | Disposition: A | Discharge status: Home / Self Care | Source: Ambulatory Visit | Attending: Radiation Oncology | Admitting: Radiation Oncology

## 2019-08-19 ENCOUNTER — Other Ambulatory Visit: Payer: Self-pay

## 2019-08-19 DIAGNOSIS — C50411 Malignant neoplasm of upper-outer quadrant of right female breast: Secondary | ICD-10-CM | POA: Diagnosis not present

## 2019-08-20 ENCOUNTER — Other Ambulatory Visit: Payer: Self-pay

## 2019-08-20 ENCOUNTER — Ambulatory Visit
Admission: RE | Admit: 2019-08-20 | Discharge: 2019-08-20 | Disposition: A | Discharge status: Home / Self Care | Source: Ambulatory Visit | Attending: Radiation Oncology | Admitting: Radiation Oncology

## 2019-08-20 DIAGNOSIS — C50411 Malignant neoplasm of upper-outer quadrant of right female breast: Secondary | ICD-10-CM | POA: Diagnosis not present

## 2019-08-23 ENCOUNTER — Ambulatory Visit
Admission: RE | Admit: 2019-08-23 | Discharge: 2019-08-23 | Disposition: A | Discharge status: Home / Self Care | Source: Ambulatory Visit | Attending: Radiation Oncology | Admitting: Radiation Oncology

## 2019-08-23 ENCOUNTER — Other Ambulatory Visit: Payer: Self-pay

## 2019-08-23 DIAGNOSIS — C50411 Malignant neoplasm of upper-outer quadrant of right female breast: Secondary | ICD-10-CM | POA: Diagnosis not present

## 2019-08-24 ENCOUNTER — Other Ambulatory Visit: Payer: Self-pay

## 2019-08-24 ENCOUNTER — Ambulatory Visit
Admission: RE | Admit: 2019-08-24 | Discharge: 2019-08-24 | Disposition: A | Discharge status: Home / Self Care | Source: Ambulatory Visit | Attending: Radiation Oncology | Admitting: Radiation Oncology

## 2019-08-24 DIAGNOSIS — C50411 Malignant neoplasm of upper-outer quadrant of right female breast: Secondary | ICD-10-CM | POA: Diagnosis not present

## 2019-08-25 ENCOUNTER — Ambulatory Visit
Admission: RE | Admit: 2019-08-25 | Discharge: 2019-08-25 | Disposition: A | Discharge status: Home / Self Care | Source: Ambulatory Visit | Attending: Radiation Oncology | Admitting: Radiation Oncology

## 2019-08-25 ENCOUNTER — Other Ambulatory Visit: Payer: Self-pay

## 2019-08-25 DIAGNOSIS — C50411 Malignant neoplasm of upper-outer quadrant of right female breast: Secondary | ICD-10-CM | POA: Diagnosis not present

## 2019-08-26 ENCOUNTER — Telehealth: Payer: Self-pay | Admitting: Genetic Counselor

## 2019-08-26 ENCOUNTER — Ambulatory Visit
Admission: RE | Admit: 2019-08-26 | Discharge: 2019-08-26 | Disposition: A | Discharge status: Home / Self Care | Source: Ambulatory Visit | Attending: Radiation Oncology | Admitting: Radiation Oncology

## 2019-08-26 ENCOUNTER — Encounter: Payer: Self-pay | Admitting: Genetic Counselor

## 2019-08-26 ENCOUNTER — Other Ambulatory Visit: Payer: Self-pay

## 2019-08-26 ENCOUNTER — Ambulatory Visit: Payer: Self-pay | Admitting: Genetic Counselor

## 2019-08-26 DIAGNOSIS — Z1379 Encounter for other screening for genetic and chromosomal anomalies: Secondary | ICD-10-CM

## 2019-08-26 DIAGNOSIS — C50411 Malignant neoplasm of upper-outer quadrant of right female breast: Secondary | ICD-10-CM | POA: Diagnosis not present

## 2019-08-26 NOTE — Progress Notes (Signed)
HPI:  Ms. Behan was previously seen in the [facility] clinic due to a personal and family history of cancer and concerns regarding a hereditary predisposition to cancer. Please refer to our prior cancer genetics clinic note for more information regarding our discussion, assessment and recommendations, at the time. Ms. Charlot recent genetic test results were disclosed to her, as were recommendations warranted by these results. These results and recommendations are discussed in more detail below.  CANCER HISTORY:  Oncology History  Malignant neoplasm of upper-outer quadrant of right breast in female, estrogen receptor positive ([place])  06/23/2019 Initial Diagnosis   Routine screening mammogram detected an 0.8cm mass in the right breast at the 9:00 position 1 cm from the nipple. 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    06/23/2019 Cancer Staging   Staging form: Breast, AJCC 8th Edition - Clinical stage from 06/23/2019: Stage IA (cT1b, cN0, cM0, G2, ER+, PR+, HER2-) - Signed by [name], NP on 06/30/2019   07/02/2019 Surgery   Right lumpectomy ([place]): invasive lobular carcinoma with LCIS, 1.4cm, grade 2, clear margins, and isolated tumor cells present in 1 of 2 lymph nodes.    07/02/2019 Cancer Staging   Staging form: Breast, AJCC 8th Edition - Pathologic stage from 07/02/2019: Stage IA (pT1c, pN0(i+), cM0, G2, ER+, PR+, HER2-) - Signed by [name], MD on 07/12/2019   07/16/2019 Oncotype testing   HIGH risk, score of 21/7%   08/25/2019 Genetic Testing   Negative genetic testing on the CancerNext-Expanded+RNAinsight.  The CancerNext-Expanded gene panel offered by [facility] and includes sequencing and rearrangement analysis for the following 67 genes: AIP, ALK, APC*, ATM*, BAP1, BARD1, BLM, BMPR1A, BRCA1*, BRCA2*, BRIP1*, CDH1*, CDK4, CDKN1B, CDKN2A, CHEK2*, DICER1, FANCC, FH, FLCN, GALNT12, HOXB13, MAX, MEN1, MET, MLH1*,  MRE11A, MSH2*, MSH6*, MUTYH*, NBN, NF1*, NF2, PALB2*, PHOX2B, PMS2*, POLD1, POLE, POT1, PRKAR1A, PTCH1, PTEN*, RAD50, RAD51C*, RAD51D*, RB1, RET, SDHA, SDHAF2, SDHB, SDHC, SDHD, SMAD4, SMARCA4, SMARCB1, SMARCE1, STK11, SUFU, TMEM127, TP53*, TSC1, TSC2, VHL and XRCC2 (sequencing and deletion/duplication); MITF (sequencing only); EPCAM and GREM1 (deletion/duplication only). DNA and RNA analyses performed for * genes. The report date is August 25, 2019.     FAMILY HISTORY:  We obtained a detailed, 4-generation family history.  Significant diagnoses are listed below: Family History  Problem Relation Age of Onset  . Lupus Mother        d. 45  . Throat cancer Father 95       d. 60  . Lupus Sister   . Heart disease Maternal Grandmother   . Alcohol abuse Maternal Grandfather        d. 43  . Asthma Paternal Grandmother   . Prostate cancer Paternal Grandfather        d. 61s  . Thyroid disease Sister   . Cancer Cousin        unknown cancer, d. 19    The patient has a son and daughter who are cancer free.  She has a brother and two sisters who do not have cancer.  Both parents are deceased.  The patient's father died of throat cancer at 71.  He had two brothers and a sister who are call cancer free.  The sister had a son who had an unknown cancer.  The paternal grandparents are deceased. The grandfather had prostate cancer and the grandmother died of an asthma attack.  The patients mother died of lupus at age 6.  She  had two sisters and a brother  Who were cancer free.  The maternal grandparents are deceased.  The grandfather died of alcohol abuse and the grandmother died of heart problems.    Ms. Kanitz is unaware of previous family history of genetic testing for hereditary cancer risks. Patient's maternal ancestors are of African American descent, and paternal ancestors are of African American descent. There is no reported Ashkenazi Jewish ancestry. There is no known consanguinity.    GENETIC TEST RESULTS: Genetic testing reported out on August 25, 2019 through the CancerNext-Expanded+RNAinsight cancer panel found no pathogenic mutations. The CancerNext-Expanded gene panel offered by [facility] and includes sequencing and rearrangement analysis for the following 67 genes: AIP, ALK, APC*, ATM*, BAP1, BARD1, BLM, BMPR1A, BRCA1*, BRCA2*, BRIP1*, CDH1*, CDK4, CDKN1B, CDKN2A, CHEK2*, DICER1, FANCC, FH, FLCN, GALNT12, HOXB13, MAX, MEN1, MET, MLH1*, MRE11A, MSH2*, MSH6*, MUTYH*, NBN, NF1*, NF2, PALB2*, PHOX2B, PMS2*, POLD1, POLE, POT1, PRKAR1A, PTCH1, PTEN*, RAD50, RAD51C*, RAD51D*, RB1, RET, SDHA, SDHAF2, SDHB, SDHC, SDHD, SMAD4, SMARCA4, SMARCB1, SMARCE1, STK11, SUFU, TMEM127, TP53*, TSC1, TSC2, VHL and XRCC2 (sequencing and deletion/duplication); MITF (sequencing only); EPCAM and GREM1 (deletion/duplication only). DNA and RNA analyses performed for * genes. The test report has been scanned into EPIC and is located under the Molecular Pathology section of the Results Review tab.  A portion of the result report is included below for reference.     We discussed with Ms. Larsson that because current genetic testing is not perfect, it is possible there may be a gene mutation in one of these genes that current testing cannot detect, but that chance is small.  We also discussed, that there could be another gene that has not yet been discovered, or that we have not yet tested, that is responsible for the cancer diagnoses in the family. It is also possible there is a hereditary cause for the cancer in the family that Ms. Notaro did not inherit and therefore was not identified in her testing.  Therefore, it is important to remain in touch with cancer genetics in the future so that we can continue to offer Ms. Cassetta the most up to date genetic testing.   ADDITIONAL GENETIC TESTING: We discussed with Ms. Lahm that her genetic testing was fairly extensive.  If there are genes identified to increase  cancer risk that can be analyzed in the future, we would be happy to discuss and coordinate this testing at that time.    CANCER SCREENING RECOMMENDATIONS: Ms. Pigman test result is considered negative (normal).  This means that we have not identified a hereditary cause for her personal and family history of cancer at this time. Most cancers happen by chance and this negative test suggests that her cancer may fall into this category.    While reassuring, this does not definitively rule out a hereditary predisposition to cancer. It is still possible that there could be genetic mutations that are undetectable by current technology. There could be genetic mutations in genes that have not been tested or identified to increase cancer risk.  Therefore, it is recommended she continue to follow the cancer management and screening guidelines provided by her oncology and primary healthcare provider.   An individual's cancer risk and medical management are not determined by genetic test results alone. Overall cancer risk assessment incorporates additional factors, including personal medical history, family history, and any available genetic information that may result in a personalized plan for cancer prevention and surveillance  RECOMMENDATIONS FOR FAMILY MEMBERS:  Individuals in  this family might be at some increased risk of developing cancer, over the general population risk, simply due to the family history of cancer.  We recommended women in this family have a yearly mammogram beginning at age 78, or 37 years younger than the earliest onset of cancer, an annual clinical breast exam, and perform monthly breast self-exams. Women in this family should also have a gynecological exam as recommended by their primary provider. All family members should have a colonoscopy by age 71.  FOLLOW-UP: Lastly, we discussed with Ms. Barich that cancer genetics is a rapidly advancing field and it is possible that new genetic  tests will be appropriate for her and/or her family members in the future. We encouraged her to remain in contact with cancer genetics on an annual basis so we can update her personal and family histories and let her know of advances in cancer genetics that may benefit this family.   Our contact number was provided. Ms. Utt questions were answered to her satisfaction, and she knows she is welcome to call us at anytime with additional questions or concerns.   Roma Kayser, [place], [facility] Licensed, Certified Genetic Counselor [email or web address]

## 2019-08-26 NOTE — Telephone Encounter (Signed)
Revealed negative genetic testing.  Discussed that we do not know why she has breast cancer or why there is cancer in the family. It could be due to a different gene that we are not testing, or maybe our current technology may not be able to pick something up.  It will be important for her to keep in contact with genetics to keep up with whether additional testing may be needed. 

## 2019-08-27 ENCOUNTER — Ambulatory Visit
Admission: RE | Admit: 2019-08-27 | Discharge: 2019-08-27 | Disposition: A | Discharge status: Home / Self Care | Source: Ambulatory Visit | Attending: Radiation Oncology | Admitting: Radiation Oncology

## 2019-08-27 DIAGNOSIS — C50411 Malignant neoplasm of upper-outer quadrant of right female breast: Secondary | ICD-10-CM | POA: Diagnosis not present

## 2019-08-30 ENCOUNTER — Other Ambulatory Visit: Payer: Self-pay

## 2019-08-30 ENCOUNTER — Ambulatory Visit
Admission: RE | Admit: 2019-08-30 | Discharge: 2019-08-30 | Disposition: A | Discharge status: Home / Self Care | Source: Ambulatory Visit | Attending: Radiation Oncology | Admitting: Radiation Oncology

## 2019-08-30 DIAGNOSIS — C50411 Malignant neoplasm of upper-outer quadrant of right female breast: Secondary | ICD-10-CM | POA: Diagnosis not present

## 2019-08-31 ENCOUNTER — Ambulatory Visit
Admission: RE | Admit: 2019-08-31 | Discharge: 2019-08-31 | Disposition: A | Discharge status: Home / Self Care | Source: Ambulatory Visit | Attending: Radiation Oncology | Admitting: Radiation Oncology

## 2019-08-31 ENCOUNTER — Other Ambulatory Visit: Payer: Self-pay

## 2019-08-31 DIAGNOSIS — C50411 Malignant neoplasm of upper-outer quadrant of right female breast: Secondary | ICD-10-CM | POA: Diagnosis not present

## 2019-09-01 ENCOUNTER — Other Ambulatory Visit: Payer: Self-pay

## 2019-09-01 ENCOUNTER — Ambulatory Visit
Admission: RE | Admit: 2019-09-01 | Discharge: 2019-09-01 | Disposition: A | Discharge status: Home / Self Care | Source: Ambulatory Visit | Attending: Radiation Oncology | Admitting: Radiation Oncology

## 2019-09-01 DIAGNOSIS — C50411 Malignant neoplasm of upper-outer quadrant of right female breast: Secondary | ICD-10-CM | POA: Diagnosis not present

## 2019-09-02 ENCOUNTER — Other Ambulatory Visit: Payer: Self-pay

## 2019-09-02 ENCOUNTER — Ambulatory Visit
Admission: RE | Admit: 2019-09-02 | Discharge: 2019-09-02 | Disposition: A | Discharge status: Home / Self Care | Source: Ambulatory Visit | Attending: Radiation Oncology | Admitting: Radiation Oncology

## 2019-09-02 DIAGNOSIS — C50411 Malignant neoplasm of upper-outer quadrant of right female breast: Secondary | ICD-10-CM | POA: Diagnosis not present

## 2019-09-03 ENCOUNTER — Ambulatory Visit

## 2019-09-06 ENCOUNTER — Other Ambulatory Visit: Payer: Self-pay

## 2019-09-06 ENCOUNTER — Ambulatory Visit
Admission: RE | Admit: 2019-09-06 | Discharge: 2019-09-06 | Disposition: A | Discharge status: Home / Self Care | Source: Ambulatory Visit | Attending: Radiation Oncology | Admitting: Radiation Oncology

## 2019-09-06 DIAGNOSIS — C50411 Malignant neoplasm of upper-outer quadrant of right female breast: Secondary | ICD-10-CM | POA: Diagnosis not present

## 2019-09-07 ENCOUNTER — Ambulatory Visit
Admission: RE | Admit: 2019-09-07 | Discharge: 2019-09-07 | Disposition: A | Discharge status: Home / Self Care | Source: Ambulatory Visit | Attending: Radiation Oncology | Admitting: Radiation Oncology

## 2019-09-07 DIAGNOSIS — C50411 Malignant neoplasm of upper-outer quadrant of right female breast: Secondary | ICD-10-CM | POA: Diagnosis not present

## 2019-09-08 ENCOUNTER — Other Ambulatory Visit: Payer: Self-pay

## 2019-09-08 ENCOUNTER — Ambulatory Visit
Admission: RE | Admit: 2019-09-08 | Discharge: 2019-09-08 | Disposition: A | Discharge status: Home / Self Care | Source: Ambulatory Visit | Attending: Radiation Oncology | Admitting: Radiation Oncology

## 2019-09-08 DIAGNOSIS — C50411 Malignant neoplasm of upper-outer quadrant of right female breast: Secondary | ICD-10-CM | POA: Diagnosis not present

## 2019-09-09 ENCOUNTER — Other Ambulatory Visit: Payer: Self-pay

## 2019-09-09 ENCOUNTER — Ambulatory Visit
Admission: RE | Admit: 2019-09-09 | Discharge: 2019-09-09 | Disposition: A | Discharge status: Home / Self Care | Source: Ambulatory Visit | Attending: Radiation Oncology | Admitting: Radiation Oncology

## 2019-09-09 DIAGNOSIS — C50411 Malignant neoplasm of upper-outer quadrant of right female breast: Secondary | ICD-10-CM | POA: Diagnosis not present

## 2019-09-10 ENCOUNTER — Other Ambulatory Visit: Payer: Self-pay

## 2019-09-10 ENCOUNTER — Ambulatory Visit
Admission: RE | Admit: 2019-09-10 | Discharge: 2019-09-10 | Disposition: A | Discharge status: Home / Self Care | Source: Ambulatory Visit | Attending: Radiation Oncology | Admitting: Radiation Oncology

## 2019-09-10 ENCOUNTER — Ambulatory Visit: Admitting: Radiation Oncology

## 2019-09-10 DIAGNOSIS — C50411 Malignant neoplasm of upper-outer quadrant of right female breast: Secondary | ICD-10-CM | POA: Diagnosis not present

## 2019-09-13 ENCOUNTER — Ambulatory Visit
Admission: RE | Admit: 2019-09-13 | Discharge: 2019-09-13 | Disposition: A | Discharge status: Home / Self Care | Source: Ambulatory Visit | Attending: Radiation Oncology | Admitting: Radiation Oncology

## 2019-09-13 ENCOUNTER — Other Ambulatory Visit: Payer: Self-pay

## 2019-09-13 DIAGNOSIS — Z170 Estrogen receptor positive status [ER+]: Secondary | ICD-10-CM | POA: Insufficient documentation

## 2019-09-13 DIAGNOSIS — C50411 Malignant neoplasm of upper-outer quadrant of right female breast: Secondary | ICD-10-CM | POA: Insufficient documentation

## 2019-09-14 ENCOUNTER — Other Ambulatory Visit: Payer: Self-pay

## 2019-09-14 ENCOUNTER — Ambulatory Visit
Admission: RE | Admit: 2019-09-14 | Discharge: 2019-09-14 | Disposition: A | Discharge status: Home / Self Care | Source: Ambulatory Visit | Attending: Radiation Oncology | Admitting: Radiation Oncology

## 2019-09-14 DIAGNOSIS — C50411 Malignant neoplasm of upper-outer quadrant of right female breast: Secondary | ICD-10-CM | POA: Diagnosis not present

## 2019-09-15 ENCOUNTER — Other Ambulatory Visit: Payer: Self-pay

## 2019-09-15 ENCOUNTER — Ambulatory Visit
Admission: RE | Admit: 2019-09-15 | Discharge: 2019-09-15 | Disposition: A | Discharge status: Home / Self Care | Source: Ambulatory Visit | Attending: Radiation Oncology | Admitting: Radiation Oncology

## 2019-09-15 DIAGNOSIS — C50411 Malignant neoplasm of upper-outer quadrant of right female breast: Secondary | ICD-10-CM | POA: Diagnosis not present

## 2019-09-16 ENCOUNTER — Ambulatory Visit

## 2019-09-16 ENCOUNTER — Other Ambulatory Visit: Payer: Self-pay

## 2019-09-16 DIAGNOSIS — C50411 Malignant neoplasm of upper-outer quadrant of right female breast: Secondary | ICD-10-CM | POA: Diagnosis not present

## 2019-09-17 ENCOUNTER — Ambulatory Visit
Admission: RE | Admit: 2019-09-17 | Discharge: 2019-09-17 | Disposition: A | Discharge status: Home / Self Care | Source: Ambulatory Visit | Attending: Radiation Oncology | Admitting: Radiation Oncology

## 2019-09-17 ENCOUNTER — Other Ambulatory Visit: Payer: Self-pay

## 2019-09-17 ENCOUNTER — Ambulatory Visit

## 2019-09-17 DIAGNOSIS — C50411 Malignant neoplasm of upper-outer quadrant of right female breast: Secondary | ICD-10-CM | POA: Diagnosis not present

## 2019-09-20 ENCOUNTER — Other Ambulatory Visit: Payer: Self-pay

## 2019-09-20 ENCOUNTER — Ambulatory Visit

## 2019-09-20 DIAGNOSIS — C50411 Malignant neoplasm of upper-outer quadrant of right female breast: Secondary | ICD-10-CM | POA: Diagnosis not present

## 2019-09-20 NOTE — Progress Notes (Signed)
Patient Care Team: [name], FNP as PCP - General (Family Medicine)  DIAGNOSIS:    ICD-10-CM   1. Malignant neoplasm of upper-outer quadrant of right breast in female, estrogen receptor positive ([place])  C50.411    Z17.0     SUMMARY OF ONCOLOGIC HISTORY: Oncology History  Malignant neoplasm of upper-outer quadrant of right breast in female, estrogen receptor positive ([place])  06/23/2019 Initial Diagnosis   Routine screening mammogram detected an 0.8cm mass in the right breast at the 9:00 position 1 cm from the nipple. 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    06/23/2019 Cancer Staging   Staging form: Breast, AJCC 8th Edition - Clinical stage from 06/23/2019: Stage IA (cT1b, cN0, cM0, G2, ER+, PR+, HER2-) - Signed by [name], NP on 06/30/2019   07/02/2019 Surgery   Right lumpectomy ([facility]i): invasive lobular carcinoma with LCIS, 1.4cm, grade 2, clear margins, and isolated tumor cells present in 1 of 2 lymph nodes.    07/02/2019 Cancer Staging   Staging form: Breast, AJCC 8th Edition - Pathologic stage from 07/02/2019: Stage IA (pT1c, pN0(i+), cM0, G2, ER+, PR+, HER2-) - Signed by [name], MD on 07/12/2019   07/16/2019 Oncotype testing   HIGH risk, score of 21/7%   08/10/2019 -  Radiation Therapy   Adjuvant radiation therapy   08/25/2019 Genetic Testing   Negative genetic testing on the CancerNext-Expanded+RNAinsight.  The CancerNext-Expanded gene panel offered by [facility] and includes sequencing and rearrangement analysis for the following 67 genes: AIP, ALK, APC*, ATM*, BAP1, BARD1, BLM, BMPR1A, BRCA1*, BRCA2*, BRIP1*, CDH1*, CDK4, CDKN1B, CDKN2A, CHEK2*, DICER1, FANCC, FH, FLCN, GALNT12, HOXB13, MAX, MEN1, MET, MLH1*, MRE11A, MSH2*, MSH6*, MUTYH*, NBN, NF1*, NF2, PALB2*, PHOX2B, PMS2*, POLD1, POLE, POT1, PRKAR1A, PTCH1, PTEN*, RAD50, RAD51C*, RAD51D*, RB1, RET, SDHA, SDHAF2, SDHB, SDHC, SDHD, SMAD4, SMARCA4,  SMARCB1, SMARCE1, STK11, SUFU, TMEM127, TP53*, TSC1, TSC2, VHL and XRCC2 (sequencing and deletion/duplication); MITF (sequencing only); EPCAM and GREM1 (deletion/duplication only). DNA and RNA analyses performed for * genes. The report date is August 25, 2019.     CHIEF COMPLIANT: Follow-up to discuss anti-estrogen therapy   INTERVAL HISTORY: Sandra Patterson is a 48 y.o. with above-mentioned history of breast cancer who underwent a lumpectomy and is currently undergoing radiation therapy. She presents to the clinic today to discuss further treatment with anti-estrogen therapy.  She is tolerating radiation fairly well with exception of mild radiation dermatitis.  She is tolerating radiation fairly well with exception of mild radiation dermatitis.She has mild radiation dermatitis  REVIEW OF SYSTEMS:   Constitutional: Denies fevers, chills or abnormal weight loss Eyes: Denies blurriness of vision Ears, nose, mouth, throat, and face: Denies mucositis or sore throat Respiratory: Denies cough, dyspnea or wheezes Cardiovascular: Denies palpitation, chest discomfort Gastrointestinal: Denies nausea, heartburn or change in bowel habits Skin: Denies abnormal skin rashes Lymphatics: Denies new lymphadenopathy or easy bruising Neurological: Denies numbness, tingling or new weaknesses Behavioral/Psych: Mood is stable, no new changes  Extremities: No lower extremity edema Breast: mild radiation dermatitis All other systems were reviewed with the patient and are negative.  I have reviewed the past medical history, past surgical history, social history and family history with the patient and they are unchanged from previous note.  ALLERGIES:  is allergic to codeine.  MEDICATIONS:  Current Outpatient Medications  Medication Sig Dispense Refill  . amLODipine (NORVASC) 5 MG tablet Take 5 mg by mouth daily.    Marland Kitchen anastrozole (ARIMIDEX) 1  MG tablet Take 1 tablet (1 mg total) by mouth daily. 90 tablet 3  .  atorvastatin (LIPITOR) 40 MG tablet Take 40 mg by mouth daily.    . benazepril (LOTENSIN) 40 MG tablet Take 40 mg by mouth 2 (two) times daily.     . carvedilol (COREG) 25 MG tablet Take 25 mg by mouth 2 (two) times daily with a meal.    . Cholecalciferol (VITAMIN D3) 1.25 MG (50000 UT) CAPS Take 1 capsule by mouth once a week.    . famotidine (PEPCID) 20 MG tablet TAKE 1 TABLET BY MOUTH ONCE DAILY IN THE MORNING    . ferrous sulfate 325 (65 FE) MG tablet Take 325 mg by mouth 2 (two) times daily.     . hydroxychloroquine (PLAQUENIL) 200 MG tablet Take 200 mg by mouth 2 (two) times daily.     . predniSONE (DELTASONE) 10 MG tablet Take 10 mg by mouth daily.    Marland Kitchen venlafaxine XR (EFFEXOR XR) 37.5 MG 24 hr capsule Take 1 capsule (37.5 mg total) by mouth daily with breakfast. 30 capsule 11  . Vitamin D, Ergocalciferol, (DRISDOL) 1.25 MG (50000 UT) CAPS capsule Take 50,000 Units by mouth every Friday.      No current facility-administered medications for this visit.     PHYSICAL EXAMINATION: ECOG PERFORMANCE STATUS: 1 - Symptomatic but completely ambulatory  Vitals:   09/21/19 1340  BP: (!) 141/73  Pulse: 73  Resp: 18  Temp: 98.7 F (37.1 C)  SpO2: 100%   Filed Weights   09/21/19 1340  Weight: 290 lb 8 oz (131.8 kg)    GENERAL: alert, no distress and comfortable SKIN: skin color, texture, turgor are normal, no rashes or significant lesions EYES: normal, Conjunctiva are pink and non-injected, sclera clear OROPHARYNX: no exudate, no erythema and lips, buccal mucosa, and tongue normal  NECK: supple, thyroid normal size, non-tender, without nodularity LYMPH: no palpable lymphadenopathy in the cervical, axillary or inguinal LUNGS: clear to auscultation and percussion with normal breathing effort HEART: regular rate & rhythm and no murmurs and no lower extremity edema ABDOMEN: abdomen soft, non-tender and normal bowel sounds MUSCULOSKELETAL: no cyanosis of digits and no clubbing  NEURO:  alert & oriented x 3 with fluent speech, no focal motor/sensory deficits EXTREMITIES: No lower extremity edema  LABORATORY DATA:  I have reviewed the data as listed CMP Latest Ref Rng & Units 06/29/2019  Glucose 70 - 99 mg/dL 124(H)  BUN 6 - 20 mg/dL 9  Creatinine 0.44 - 1.00 mg/dL 1.53(H)  Sodium 135 - 145 mmol/L 135  Potassium 3.5 - 5.1 mmol/L 3.4(L)  Chloride 98 - 111 mmol/L 101  CO2 22 - 32 mmol/L 24  Calcium 8.9 - 10.3 mg/dL 9.8    Lab Results  Component Value Date   WBC 7.2 06/29/2019   HGB 15.5 (H) 06/29/2019   HCT 47.5 (H) 06/29/2019   MCV 95.6 06/29/2019   PLT 175 06/29/2019    ASSESSMENT & PLAN:  Malignant neoplasm of upper-outer quadrant of right breast in female, estrogen receptor positive ([place]) 06/23/2019:Routine screening mammogram detected an 0.8cm mass in the right breast at the 9:00 position 1 cm from the nipple. 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 T1CN0 stage Ia clinical stage History of rheumatoid arthritis and lupus and hypertension  Treatment plan: 1. Breast conserving surgery:Right lumpectomy ([facility]i): invasive lobular carcinoma with LCIS, 1.4cm, grade 2, clear margins, and isolated tumor cells present in 1 of  2 lymph nodes. 2. Oncotype DX testing: Recurrence score 21, recommended chemotherapy but patient refused because of small benefit 3. Adjuvant radiation therapy 08/10/2019-09/24/2019 4. Adjuvant antiestrogen therapy with tamoxifen to start 10/11/2019 ---------------------------------------------------------------------------------------------------------------- Treatment plan: Adjuvant antiestrogen therapy with tamoxifen 20 mg daily x10 years Tamoxifen counseling:We discussed the risks and benefits of tamoxifen. These include but not limited to insomnia, hot flashes, mood changes, vaginal dryness, and weight gain. Although rare, serious side effects including endometrial cancer, risk of blood clots were also  discussed. We strongly believe that the benefits far outweigh the risks. Patient understands these risks and consented to starting treatment. Planned treatment duration is 10 years.  She agreed to participate in the antiestrogen therapy compliance study being conducted by [facility] Return to clinic in 3 months for survivorship care plan visit   No orders of the defined types were placed in this encounter.  The patient has a good understanding of the overall plan. she agrees with it. she will call with any problems that may develop before the next visit here.  [name], MD 09/21/2019  Julious Oka Dorshimer am acting as scribe for Dr. [name].  I have reviewed the above documentation for accuracy and completeness, and I agree with the above.

## 2019-09-21 ENCOUNTER — Ambulatory Visit

## 2019-09-21 ENCOUNTER — Other Ambulatory Visit: Payer: Self-pay

## 2019-09-21 ENCOUNTER — Encounter (INDEPENDENT_AMBULATORY_CARE_PROVIDER_SITE_OTHER): Payer: Self-pay

## 2019-09-21 ENCOUNTER — Inpatient Hospital Stay: Attending: Oncology | Admitting: Hematology and Oncology

## 2019-09-21 DIAGNOSIS — C50411 Malignant neoplasm of upper-outer quadrant of right female breast: Secondary | ICD-10-CM

## 2019-09-21 DIAGNOSIS — Z79810 Long term (current) use of selective estrogen receptor modulators (SERMs): Secondary | ICD-10-CM | POA: Insufficient documentation

## 2019-09-21 DIAGNOSIS — Z923 Personal history of irradiation: Secondary | ICD-10-CM | POA: Diagnosis not present

## 2019-09-21 DIAGNOSIS — M069 Rheumatoid arthritis, unspecified: Secondary | ICD-10-CM | POA: Diagnosis not present

## 2019-09-21 DIAGNOSIS — Z79899 Other long term (current) drug therapy: Secondary | ICD-10-CM | POA: Diagnosis not present

## 2019-09-21 DIAGNOSIS — Z170 Estrogen receptor positive status [ER+]: Secondary | ICD-10-CM | POA: Diagnosis not present

## 2019-09-21 DIAGNOSIS — I10 Essential (primary) hypertension: Secondary | ICD-10-CM | POA: Diagnosis not present

## 2019-09-21 DIAGNOSIS — M329 Systemic lupus erythematosus, unspecified: Secondary | ICD-10-CM | POA: Diagnosis not present

## 2019-09-21 MED ORDER — ANASTROZOLE 1 MG PO TABS: 1.0000 mg | ORAL_TABLET | Freq: Every day | ORAL | 3 refills | Status: DC

## 2019-09-21 MED ORDER — VENLAFAXINE HCL ER 37.5 MG PO CP24: 37.5000 mg | ORAL_CAPSULE | Freq: Every day | ORAL | 11 refills | Status: DC

## 2019-09-21 NOTE — Assessment & Plan Note (Signed)
06/23/2019:Routine screening mammogram detected an 0.8cm mass in the right breast at the 9:00 position 1 cm from the nipple. 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 T1CN0 stage Ia clinical stage History of rheumatoid arthritis and lupus and hypertension  Treatment plan: 1. Breast conserving surgery:Right lumpectomy (Ts[facility]i): invasive lobular carcinoma with LCIS, 1.4cm, grade 2, clear margins, and isolated tumor cells present in 1 of 2 lymph nodes. 2. Oncotype DX testing: Recurrence score 21, recommended chemotherapy but patient refused because of small benefit 3. Adjuvant radiation therapy 08/10/2019-09/21/2019 4. Adjuvant antiestrogen therapy ---------------------------------------------------------------------------------------------------------------- Treatment plan: Adjuvant antiestrogen therapy with tamoxifen 20 mg daily x10 years Tamoxifen counseling:We discussed the risks and benefits of tamoxifen. These include but not limited to insomnia, hot flashes, mood changes, vaginal dryness, and weight gain. Although rare, serious side effects including endometrial cancer, risk of blood clots were also discussed. We strongly believe that the benefits far outweigh the risks. Patient understands these risks and consented to starting treatment. Planned treatment duration is 10 years.  Return to clinic in 3 months for survivorship care plan visit

## 2019-09-22 ENCOUNTER — Telehealth: Payer: Self-pay | Admitting: Hematology and Oncology

## 2019-09-22 ENCOUNTER — Ambulatory Visit
Admission: RE | Admit: 2019-09-22 | Discharge: 2019-09-22 | Disposition: A | Discharge status: Home / Self Care | Source: Ambulatory Visit | Attending: Radiation Oncology | Admitting: Radiation Oncology

## 2019-09-22 ENCOUNTER — Ambulatory Visit

## 2019-09-22 ENCOUNTER — Other Ambulatory Visit: Payer: Self-pay | Admitting: Adult Health

## 2019-09-22 DIAGNOSIS — C50411 Malignant neoplasm of upper-outer quadrant of right female breast: Secondary | ICD-10-CM

## 2019-09-22 DIAGNOSIS — Z170 Estrogen receptor positive status [ER+]: Secondary | ICD-10-CM

## 2019-09-22 NOTE — Telephone Encounter (Signed)
I talk with patient regarding schedule  

## 2019-09-23 ENCOUNTER — Ambulatory Visit
Admission: RE | Admit: 2019-09-23 | Discharge: 2019-09-23 | Disposition: A | Discharge status: Home / Self Care | Source: Ambulatory Visit | Attending: Radiation Oncology | Admitting: Radiation Oncology

## 2019-09-23 ENCOUNTER — Other Ambulatory Visit: Payer: Self-pay

## 2019-09-23 ENCOUNTER — Ambulatory Visit

## 2019-09-23 DIAGNOSIS — C50411 Malignant neoplasm of upper-outer quadrant of right female breast: Secondary | ICD-10-CM | POA: Diagnosis not present

## 2019-09-24 ENCOUNTER — Encounter: Payer: Self-pay | Admitting: *Deleted

## 2019-09-24 ENCOUNTER — Encounter: Payer: Self-pay | Admitting: Radiation Oncology

## 2019-09-24 ENCOUNTER — Other Ambulatory Visit: Payer: Self-pay

## 2019-09-24 ENCOUNTER — Ambulatory Visit
Admission: RE | Admit: 2019-09-24 | Discharge: 2019-09-24 | Disposition: A | Discharge status: Home / Self Care | Source: Ambulatory Visit | Attending: Radiation Oncology | Admitting: Radiation Oncology

## 2019-09-24 DIAGNOSIS — C50411 Malignant neoplasm of upper-outer quadrant of right female breast: Secondary | ICD-10-CM | POA: Diagnosis not present

## 2019-10-18 ENCOUNTER — Encounter (INDEPENDENT_AMBULATORY_CARE_PROVIDER_SITE_OTHER): Payer: Self-pay

## 2019-10-25 ENCOUNTER — Encounter (INDEPENDENT_AMBULATORY_CARE_PROVIDER_SITE_OTHER): Payer: Self-pay

## 2019-10-27 ENCOUNTER — Telehealth: Payer: Self-pay | Admitting: Radiation Oncology

## 2019-10-27 NOTE — Telephone Encounter (Signed)
  Radiation Oncology         [phone] ________________________________  Name: Sandra Patterson MRN: [id number]  Date of Service: 10/27/2019  DOB: [birth year]  Post Treatment Telephone Note  Diagnosis:  Stage IA, pT1cN0M0(i+) grade 2, ER/PR positive invasive lobular carcinoma of the right breast.  Interval Since Last Radiation:  5 weeks   08/09/2019-09/24/2019: The right breast was treated to 50.4 Gy in 28 fractions followed by a 10 Gy boost in 5 fractions to the lumpectomy site.   Narrative:  The patient was contacted today for routine follow-up. During treatment she did very well with radiotherapy and did not have significant desquamation. She reports she is doing well and her skin is healing with only minimal hyperpigmenation.  Impression/Plan: 1. Stage IA, pT1cN0M0(i+) grade 2, ER/PR positive invasive lobular carcinoma of the right breast. The patient has been doing well since completion of radiotherapy. We discussed that we would be happy to continue to follow her as needed, but she will also continue to follow up with Dr. Lindi Adie in medical oncology. She was counseled on skin care as well as measures to avoid sun exposure to this area.  2. Survivorship. We discussed the importance of survivorship evaluation and she is already scheduled for this visit in February 2021.    Carola Rhine, PAC

## 2019-11-01 ENCOUNTER — Encounter (INDEPENDENT_AMBULATORY_CARE_PROVIDER_SITE_OTHER): Payer: Self-pay

## 2019-11-08 ENCOUNTER — Encounter (INDEPENDENT_AMBULATORY_CARE_PROVIDER_SITE_OTHER): Payer: Self-pay

## 2019-11-15 ENCOUNTER — Encounter (INDEPENDENT_AMBULATORY_CARE_PROVIDER_SITE_OTHER): Payer: Self-pay

## 2019-11-22 ENCOUNTER — Encounter (INDEPENDENT_AMBULATORY_CARE_PROVIDER_SITE_OTHER): Payer: Self-pay

## 2019-11-29 ENCOUNTER — Encounter (INDEPENDENT_AMBULATORY_CARE_PROVIDER_SITE_OTHER): Payer: Self-pay

## 2019-12-13 ENCOUNTER — Encounter (INDEPENDENT_AMBULATORY_CARE_PROVIDER_SITE_OTHER): Payer: Self-pay

## 2019-12-20 ENCOUNTER — Encounter (INDEPENDENT_AMBULATORY_CARE_PROVIDER_SITE_OTHER): Payer: Self-pay

## 2019-12-24 ENCOUNTER — Telehealth: Payer: Self-pay | Admitting: *Deleted

## 2019-12-24 NOTE — Telephone Encounter (Signed)
Left vm for pt to return call regarding answers to questionnaire on AI study. Contact information provided for return call to discuss and make appt with Mendel Ryder.

## 2019-12-26 IMAGING — MG MM PLC BREAST LOC DEV 1ST LESION INC*R*
8 of 12 series · 8 of 20 positions shown · non-contrast
Comparison: Previous exam(s).

CLINICAL DATA: Patient for preoperative localization prior to right
lumpectomy.

EXAM:
MAMMOGRAPHIC GUIDED RADIOACTIVE SEED LOCALIZATION OF THE RIGHT
BREAST

[R CC (1 of 5)]
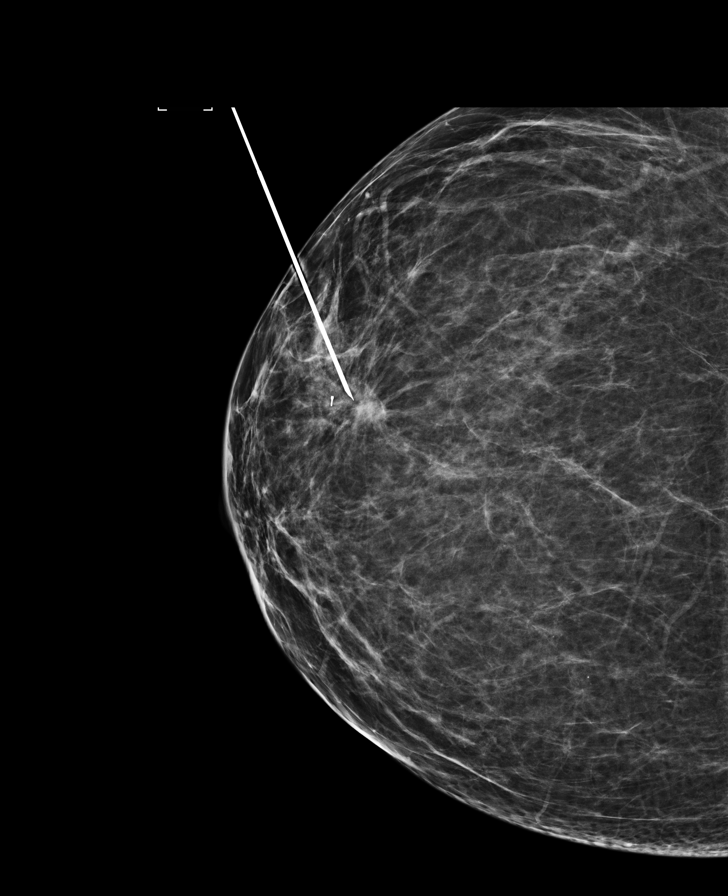

[R CC (2 of 5)]
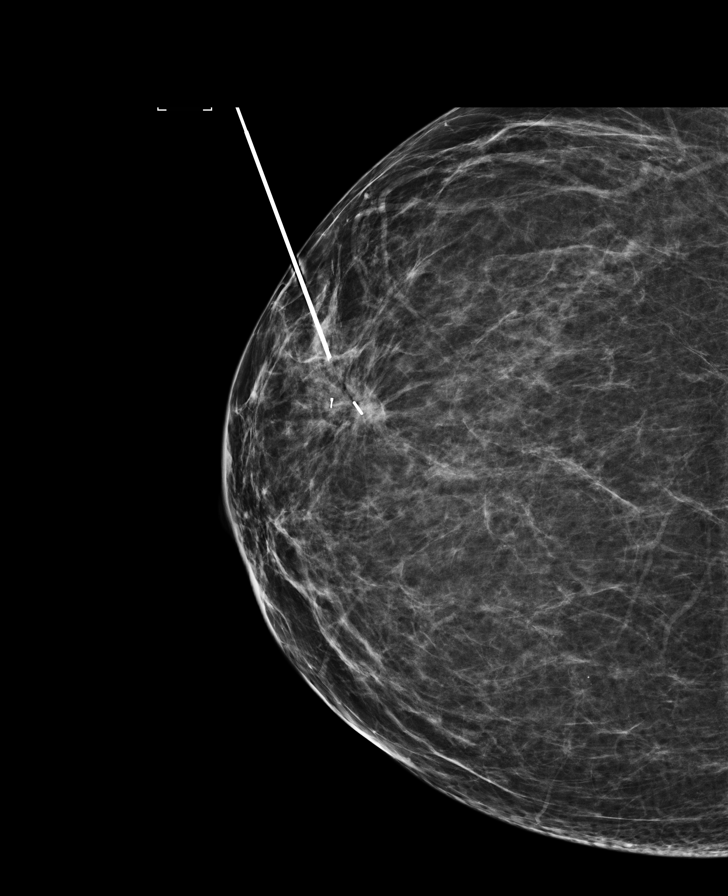

[R CC (3 of 5)]
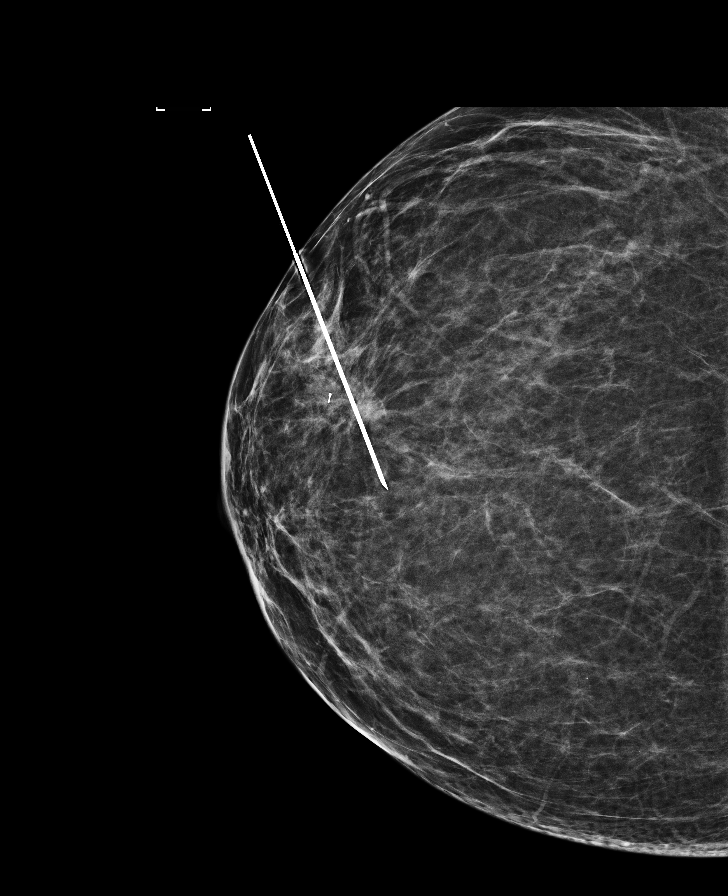

[R CC (4 of 5)]
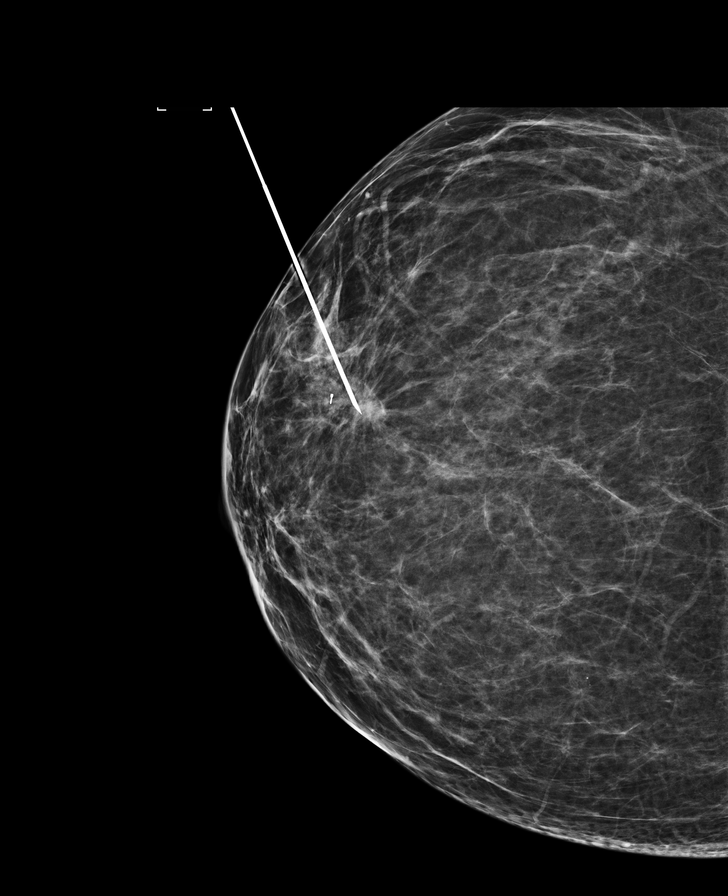

[R LM (1 of 2)]
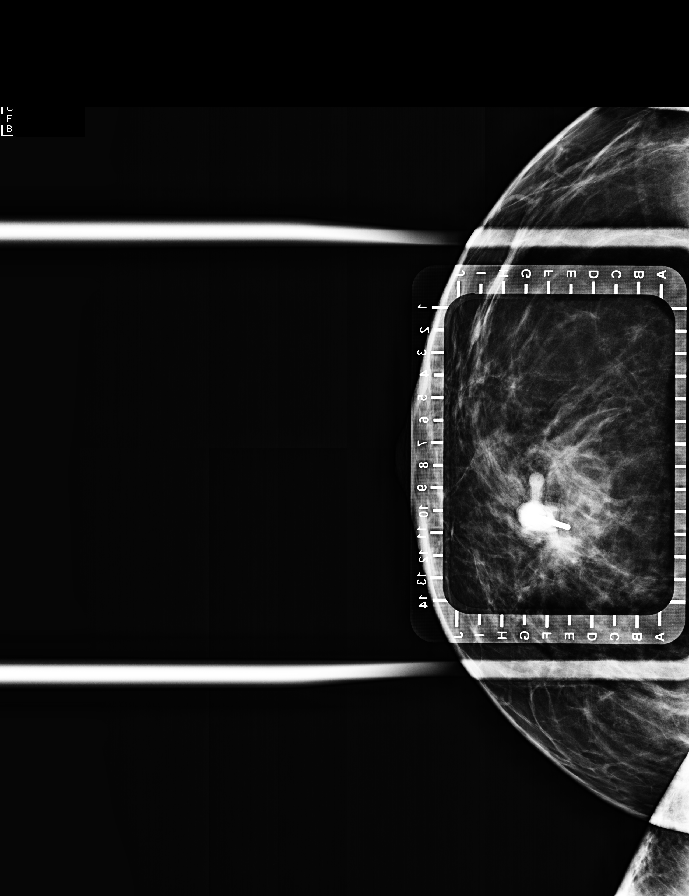

[R LM (2 of 2)]
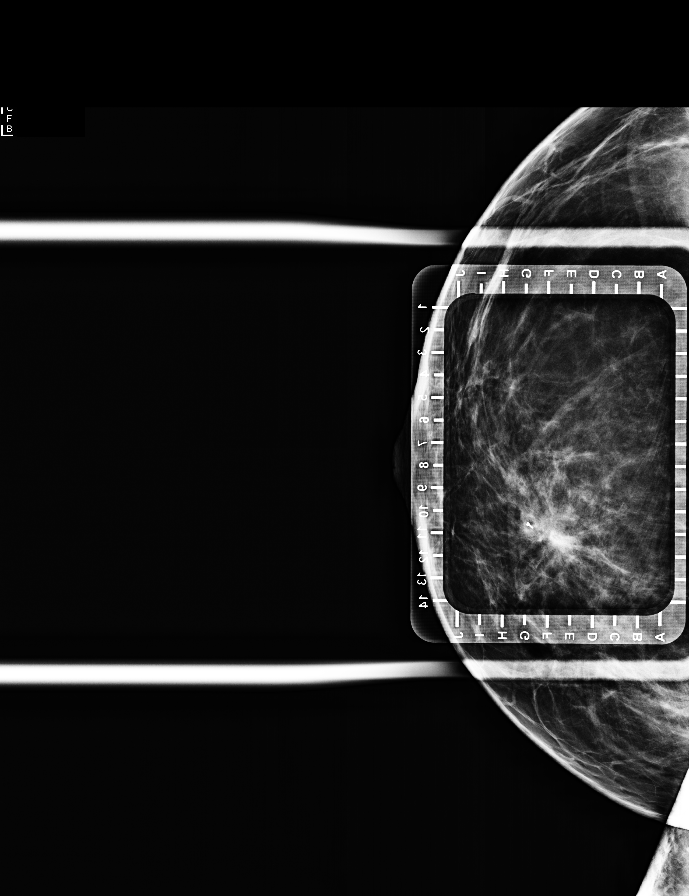

[R CC (5 of 5)]
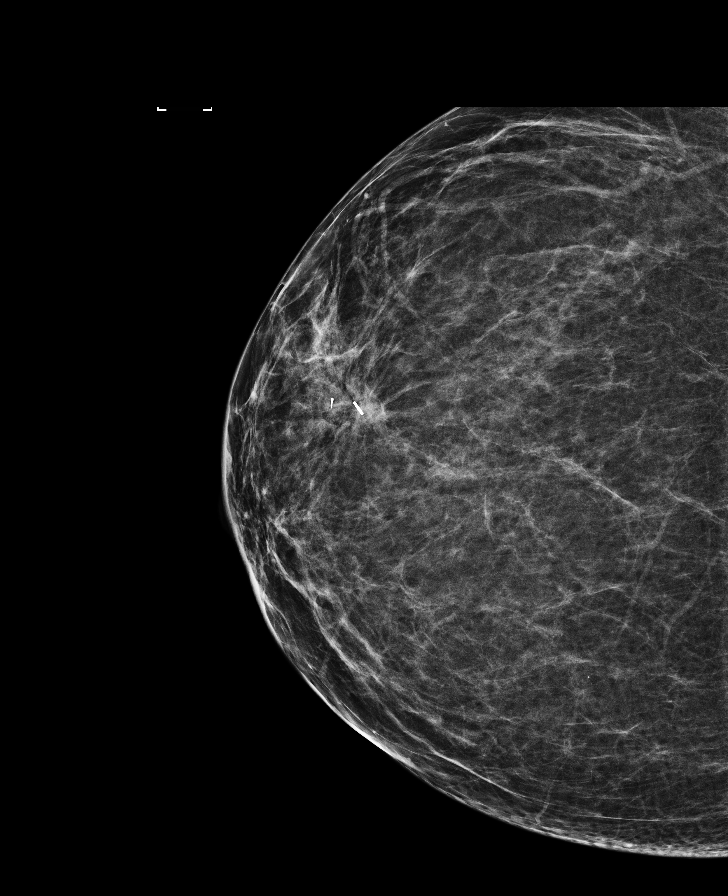

[R ML]
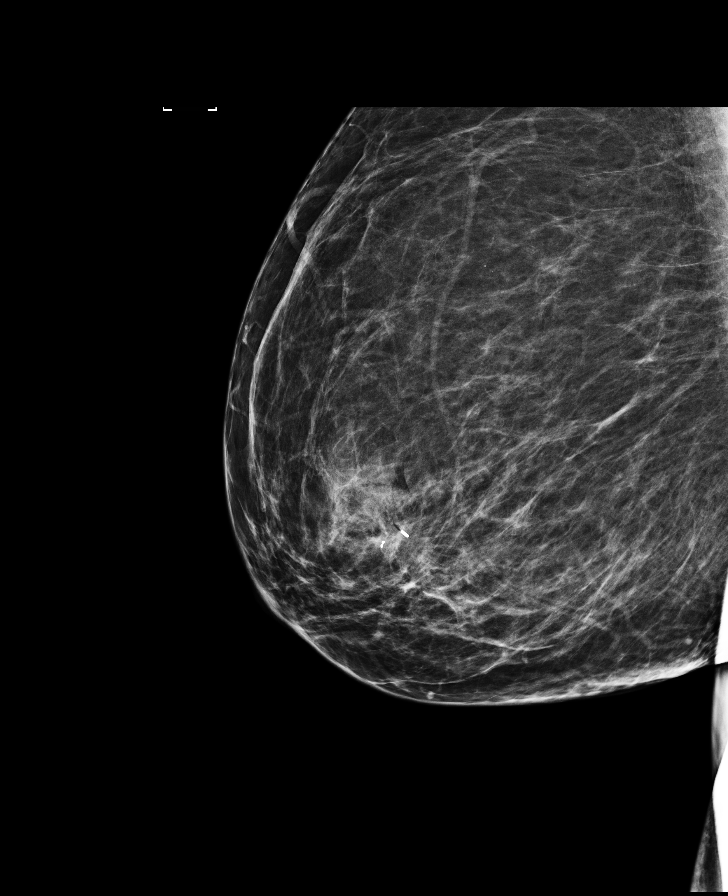

[8 of 20 positions shown; findings below may reference images not displayed]

FINDINGS: Patient presents for radioactive seed localization prior to right
lumpectomy. I met with the patient and we discussed the procedure of
seed localization including benefits and alternatives. We discussed
the high likelihood of a successful procedure. We discussed the
risks of the procedure including infection, bleeding, tissue injury
and further surgery. We discussed the low dose of radioactivity
involved in the procedure. Informed, written consent was given.

The usual time-out protocol was performed immediately prior to the
procedure.

Using mammographic guidance, sterile technique, 1% lidocaine and an
S-HD2 radioactive seed, mass and biopsy marking clip was localized
using a lateral approach. The follow-up mammogram images confirm the
seed in the expected location and were marked for Dr. Gizelle.

Follow-up survey of the patient confirms presence of the radioactive
seed.

Order number of S-HD2 seed:  [id number].

Total activity:  0.251 millicuries reference Date: 06/11/2019

The patient tolerated the procedure well and was released from the
[REDACTED]. She was given instructions regarding seed removal.
IMPRESSION: Radioactive seed localization right breast. No apparent
complications.

## 2019-12-27 ENCOUNTER — Encounter (INDEPENDENT_AMBULATORY_CARE_PROVIDER_SITE_OTHER): Payer: Self-pay

## 2019-12-27 IMAGING — MG BREAST SURGICAL SPECIMEN
1 series · 1 of 1 positions shown · non-contrast
Comparison: Previous exam(s).

CLINICAL DATA: 48-year-old patient had a radioactive seed
localization of a biopsy-proven malignancy in right breast.

EXAM:
SPECIMEN RADIOGRAPH OF THE RIGHT BREAST

[R]
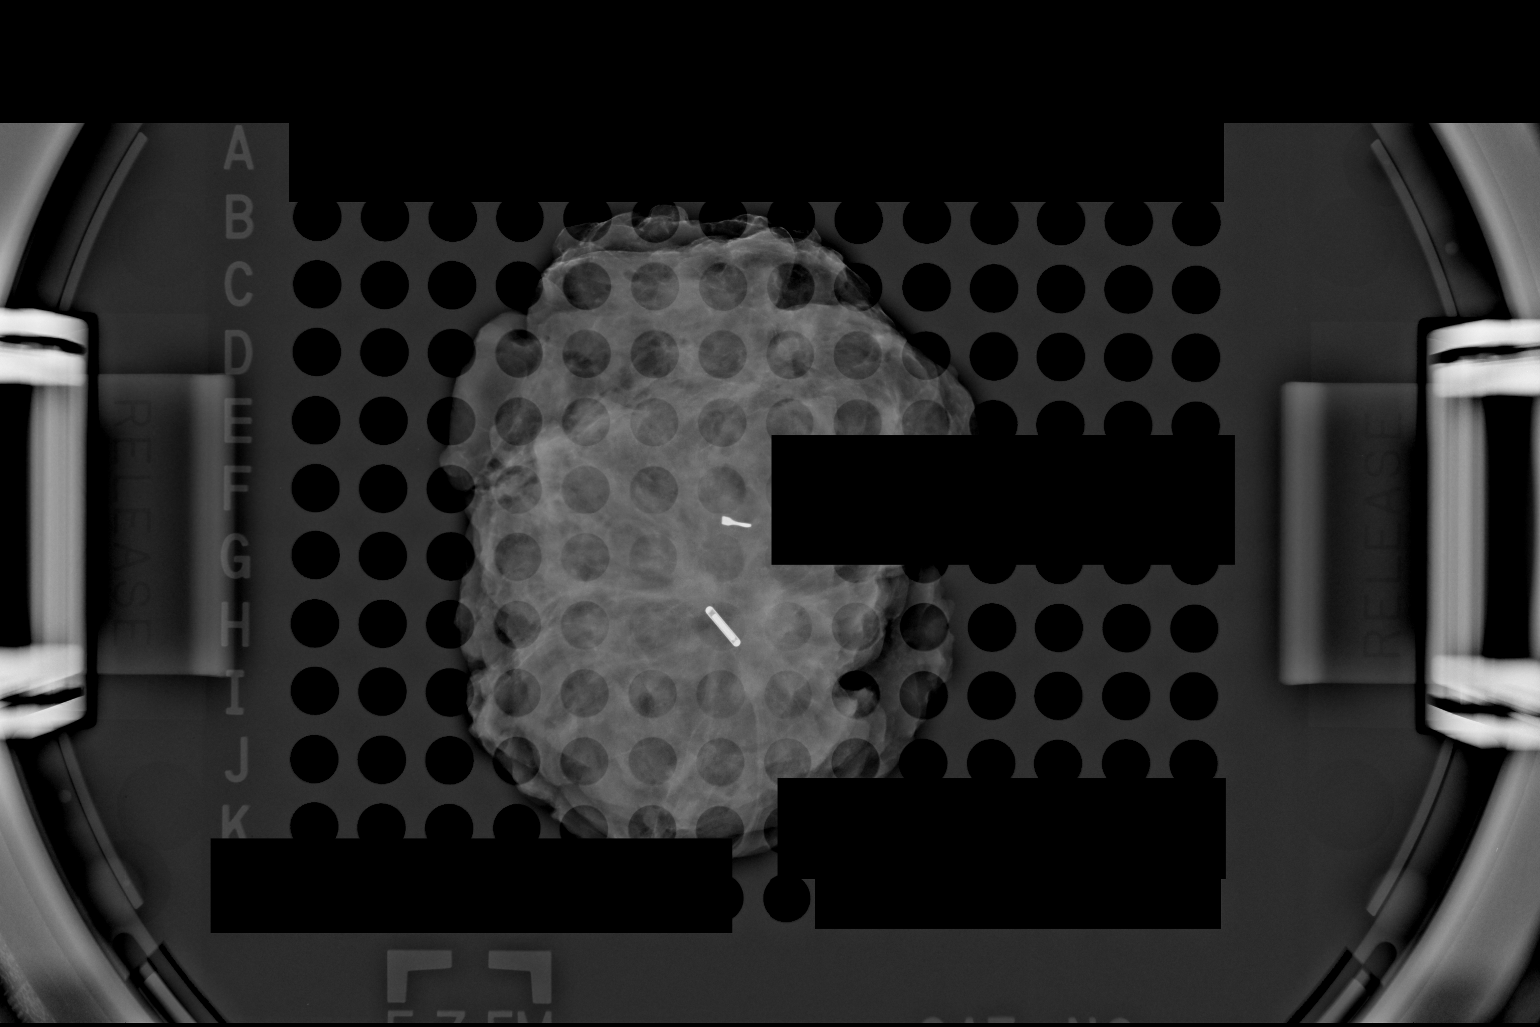

[1 of 1 positions shown; findings below may reference images not displayed]

FINDINGS: Status post excision of the right breast. The radioactive seed and
biopsy marker clip are present, completely intact, and were marked
for pathology.
IMPRESSION: Specimen radiograph of the right breast.

## 2019-12-28 ENCOUNTER — Encounter: Payer: Self-pay | Admitting: Adult Health

## 2019-12-28 ENCOUNTER — Inpatient Hospital Stay: Attending: Oncology | Admitting: Adult Health

## 2019-12-28 ENCOUNTER — Other Ambulatory Visit: Payer: Self-pay

## 2019-12-28 VITALS — BP 138/68 | HR 69 | Temp 98.2°F | Resp 20 | Ht 69.0 in | Wt 290.4 lb

## 2019-12-28 DIAGNOSIS — I129 Hypertensive chronic kidney disease with stage 1 through stage 4 chronic kidney disease, or unspecified chronic kidney disease: Secondary | ICD-10-CM | POA: Diagnosis not present

## 2019-12-28 DIAGNOSIS — Z808 Family history of malignant neoplasm of other organs or systems: Secondary | ICD-10-CM | POA: Diagnosis not present

## 2019-12-28 DIAGNOSIS — Z809 Family history of malignant neoplasm, unspecified: Secondary | ICD-10-CM | POA: Insufficient documentation

## 2019-12-28 DIAGNOSIS — N951 Menopausal and female climacteric states: Secondary | ICD-10-CM | POA: Insufficient documentation

## 2019-12-28 DIAGNOSIS — E1122 Type 2 diabetes mellitus with diabetic chronic kidney disease: Secondary | ICD-10-CM | POA: Diagnosis not present

## 2019-12-28 DIAGNOSIS — Z79811 Long term (current) use of aromatase inhibitors: Secondary | ICD-10-CM | POA: Diagnosis not present

## 2019-12-28 DIAGNOSIS — Z7952 Long term (current) use of systemic steroids: Secondary | ICD-10-CM | POA: Insufficient documentation

## 2019-12-28 DIAGNOSIS — L930 Discoid lupus erythematosus: Secondary | ICD-10-CM | POA: Insufficient documentation

## 2019-12-28 DIAGNOSIS — N189 Chronic kidney disease, unspecified: Secondary | ICD-10-CM | POA: Diagnosis not present

## 2019-12-28 DIAGNOSIS — M069 Rheumatoid arthritis, unspecified: Secondary | ICD-10-CM | POA: Diagnosis not present

## 2019-12-28 DIAGNOSIS — F17210 Nicotine dependence, cigarettes, uncomplicated: Secondary | ICD-10-CM | POA: Insufficient documentation

## 2019-12-28 DIAGNOSIS — Z923 Personal history of irradiation: Secondary | ICD-10-CM | POA: Diagnosis not present

## 2019-12-28 DIAGNOSIS — Z79899 Other long term (current) drug therapy: Secondary | ICD-10-CM | POA: Diagnosis not present

## 2019-12-28 DIAGNOSIS — Z170 Estrogen receptor positive status [ER+]: Secondary | ICD-10-CM | POA: Diagnosis not present

## 2019-12-28 DIAGNOSIS — C50411 Malignant neoplasm of upper-outer quadrant of right female breast: Secondary | ICD-10-CM | POA: Insufficient documentation

## 2019-12-28 MED ORDER — TAMOXIFEN CITRATE 10 MG PO TABS: 10.0000 mg | ORAL_TABLET | Freq: Two times a day (BID) | ORAL | 3 refills | Status: DC

## 2019-12-28 MED ORDER — VENLAFAXINE HCL ER 75 MG PO CP24: 75.0000 mg | ORAL_CAPSULE | Freq: Every day | ORAL | 3 refills | Status: DC

## 2019-12-28 NOTE — Progress Notes (Signed)
SURVIVORSHIP VISIT:   BRIEF ONCOLOGIC HISTORY:  Oncology History  Malignant neoplasm of upper-outer quadrant of right breast in female, estrogen receptor positive ([place])  06/23/2019 Initial Diagnosis   Routine screening mammogram detected an 0.8cm mass in the right breast at the 9:00 position 1 cm from the nipple. 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    06/23/2019 Cancer Staging   Staging form: Breast, AJCC 8th Edition - Clinical stage from 06/23/2019: Stage IA (cT1b, cN0, cM0, G2, ER+, PR+, HER2-) - Signed by [name], NP on 06/30/2019   07/02/2019 Surgery   Right lumpectomy [name]) [phone]): invasive lobular carcinoma with LCIS, 1.4cm, grade 2, clear margins. ER positive, PR positive, HER-2 negative, Ki67 30%. Isolated tumor cells present in 1 of 2 lymph nodes.    07/02/2019 Cancer Staging   Staging form: Breast, AJCC 8th Edition - Pathologic stage from 07/02/2019: Stage IA (pT1c, pN0(i+), cM0, G2, ER+, PR+, HER2-) - Signed by [name], MD on 07/12/2019   07/16/2019 Oncotype testing   The Oncotype DX score was 21 predicting a risk of outside the breast recurrence over the next 9 years of 7% if the patient's only systemic therapy is tamoxifen for 5 years.    08/10/2019 - 09/24/2019 Radiation Therapy   The patient initially received a dose of 50.4 Gy in 28 fractions to the breast using whole-breast tangent fields. This was delivered using a 3-D conformal technique. The pt received a boost delivering an additional 10 Gy in 5 fractions using a electron boost with 65mV electrons. The total dose was 60.4 Gy.    08/25/2019 Genetic Testing   Negative genetic testing on the CancerNext-Expanded+RNAinsight.  The Cancer[facility]Expanded [facility] panel [facility] includes sequencing and rearrangement analysis for the following 67 genes: AIP, ALK, APC*, ATM*, BAP1, BARD1, BLM, BMPR1A, BRCA1*, BRCA2*, BRIP1*, CDH1*, CDK4, CDKN1B,  CDKN2A, CHEK2*, DICER1, FANCC, FH, FLCN, GALNT12, HOXB13, MAX, MEN1, MET, MLH1*, MRE11A, MSH2*, MSH6*, MUTYH*, NBN, NF1*, NF2, PALB2*, PHOX2B, PMS2*, POLD1, POLE, POT1, PRKAR1A, PTCH1, PTEN*, RAD50, RAD51C*, RAD51D*, RB1, RET, SDHA, SDHAF2, SDHB, SDHC, SDHD, SMAD4, SMARCA4, SMARCB1, SMARCE1, STK11, SUFU, TMEM127, TP53*, TSC1, TSC2, VHL and XRCC2 (sequencing and deletion/duplication); MITF (sequencing only); EPCAM and GREM1 (deletion/duplication only). DNA and RNA analyses performed for * genes. The report date is August 25, 2019.   09/2019 - 09/2029 Anti-estrogen oral therapy   Tamoxifen     INTERVAL HISTORY:  Ms. GDascenzoto review her survivorship care plan detailing her treatment course for breast cancer, as well as monitoring long-term side effects of that treatment, education regarding health maintenance, screening, and overall wellness and health promotion.     Overall, Ms. GHutmacherreports feeling quite well.  She was prescribed Anastrozole in November by Dr. GLindi Adieand this is what she has been taking.  She had a cycle last month.  Other than that she has not had a menstrual cycle.  She has no history of blood clots, or any cardiac issues.  She has no arthralgias.  She continues to have hot flashes and is taking effexor 37.576m  Otherwise, she is doing well.    REVIEW OF SYSTEMS:  Review of Systems  Constitutional: Negative for appetite change, chills, fatigue, fever and unexpected weight change.  HENT:   Negative for hearing loss, sore throat and trouble swallowing.   Eyes: Negative for eye problems and icterus.  Respiratory: Negative for chest tightness, cough and shortness of breath.   Cardiovascular: Negative for  chest pain, leg swelling and palpitations.  Gastrointestinal: Negative for abdominal distention, abdominal pain, constipation, diarrhea, nausea and vomiting.  Endocrine: Positive for hot flashes.  Musculoskeletal: Negative for arthralgias.  Skin: Negative for itching and rash.   Neurological: Negative for dizziness, extremity weakness, headaches and numbness.  Hematological: Negative for adenopathy. Does not bruise/bleed easily.  Psychiatric/Behavioral: Negative for depression. The patient is not nervous/anxious.   Breast: Denies any new nodularity, masses, tenderness, nipple changes, or nipple discharge.      ONCOLOGY TREATMENT TEAM:  1. Surgeon:  Dr. [name] at [facility] 2. Medical Oncologist: Dr. Lindi Adie  3. Radiation Oncologist: Dr. Lisbeth Renshaw    PAST MEDICAL/SURGICAL HISTORY:  Past Medical History:  Diagnosis Date  . Anemia   . Cancer ([place])    Right breast  . CKD (chronic kidney disease)   . Diabetes mellitus without complication (HCC)    DM2, A1c 6.6% 06/29/19 at [facility]  . Family history of prostate cancer   . GERD (gastroesophageal reflux disease)   . Headache    pt. relates this to stress somewhat   . Hypertension   . Lupus (systemic lupus erythematosus) ([place])   . Rheumatoid arthritis (HCC)    RA, followed by Dr. Scarlette Shorts in [place] for Lupus & RA   Past Surgical History:  Procedure Laterality Date  . BREAST LUMPECTOMY WITH RADIOACTIVE SEED AND SENTINEL LYMPH NODE BIOPSY Right 07/02/2019   Procedure: RIGHT BREAST LUMPECTOMY WITH RADIOACTIVE SEED AND SENTINEL LYMPH NODE BIOPSY;  Surgeon: [name], MD;  Location: [facility];  Service: General;  Laterality: Right;  . CHOLECYSTECTOMY    . FRACTURE SURGERY Left 2010   pin removed from hand   . TUBAL LIGATION    . VAGINAL DELIVERY     x2     ALLERGIES:  Allergies  Allergen Reactions  . Codeine Itching, Nausea Only and Rash     CURRENT MEDICATIONS:  Outpatient Encounter Medications as of 12/28/2019  Medication Sig  . amLODipine (NORVASC) 5 MG tablet Take 5 mg by mouth daily.  Marland Kitchen atorvastatin (LIPITOR) 40 MG tablet Take 40 mg by mouth daily.  . benazepril (LOTENSIN) 40 MG tablet Take 40 mg by mouth 2 (two) times daily.   . carvedilol (COREG) 25 MG tablet Take 25 mg by mouth 2  (two) times daily with a meal.  . Cholecalciferol (VITAMIN D3) 1.25 MG (50000 UT) CAPS Take 1 capsule by mouth once a week.  . famotidine (PEPCID) 20 MG tablet TAKE 1 TABLET BY MOUTH ONCE DAILY IN THE MORNING  . ferrous sulfate 325 (65 FE) MG tablet Take 325 mg by mouth 2 (two) times daily.   . hydroxychloroquine (PLAQUENIL) 200 MG tablet Take 200 mg by mouth 2 (two) times daily.   . predniSONE (DELTASONE) 10 MG tablet Take 10 mg by mouth daily.  . tamoxifen (NOLVADEX) 10 MG tablet Take 1 tablet (10 mg total) by mouth 2 (two) times daily.  Marland Kitchen venlafaxine XR (EFFEXOR XR) 75 MG 24 hr capsule Take 1 capsule (75 mg total) by mouth daily with breakfast.  . Vitamin D, Ergocalciferol, (DRISDOL) 1.25 MG (50000 UT) CAPS capsule Take 50,000 Units by mouth every Friday.   . [DISCONTINUED] anastrozole (ARIMIDEX) 1 MG tablet Take 1 tablet (1 mg total) by mouth daily.  . [DISCONTINUED] venlafaxine XR (EFFEXOR XR) 37.5 MG 24 hr capsule Take 1 capsule (37.5 mg total) by mouth daily with breakfast.   No facility-administered encounter medications on file as of 12/28/2019.  ONCOLOGIC FAMILY HISTORY:  Family History  Problem Relation Age of Onset  . Lupus Mother        d. 22  . Throat cancer Father 57       d. 76  . Lupus Sister   . Heart disease Maternal Grandmother   . Alcohol abuse Maternal Grandfather        d. 51  . Asthma Paternal Grandmother   . Prostate cancer Paternal Grandfather        d. 50s  . Thyroid disease Sister   . Cancer Cousin        unknown cancer, d. 59     GENETIC COUNSELING/TESTING: See above  SOCIAL HISTORY:  Social History   Socioeconomic History  . Marital status: Married    Spouse name: Not on file  . Number of children: Not on file  . Years of education: Not on file  . Highest education level: Not on file  Occupational History  . Not on file  Tobacco Use  . Smoking status: Current Every Day Smoker    Packs/day: 0.50    Types: Cigarettes  . Smokeless  tobacco: Never Used  Substance and Sexual Activity  . Alcohol use: Yes    Alcohol/week: 2.0 standard drinks    Types: 2 Glasses of wine per week    Comment: wine or vodka & coke-   . Drug use: Never  . Sexual activity: Yes    Birth control/protection: None  Other Topics Concern  . Not on file  Social History Narrative  . Not on file   Social Determinants of Health   Financial Resource Strain:   . Difficulty of Paying Living Expenses: Not on file  Food Insecurity:   . Worried About Charity fundraiser in the Last Year: Not on file  . Ran Out of Food in the Last Year: Not on file  Transportation Needs: No Transportation Needs  . Lack of Transportation (Medical): No  . Lack of Transportation (Non-Medical): No  Physical Activity:   . Days of Exercise per Week: Not on file  . Minutes of Exercise per Session: Not on file  Stress:   . Feeling of Stress : Not on file  Social Connections:   . Frequency of Communication with Friends and Family: Not on file  . Frequency of Social Gatherings with Friends and Family: Not on file  . Attends Religious Services: Not on file  . Active Member of Clubs or Organizations: Not on file  . Attends Archivist Meetings: Not on file  . Marital Status: Not on file  Intimate Partner Violence:   . Fear of Current or Ex-Partner: Not on file  . Emotionally Abused: Not on file  . Physically Abused: Not on file  . Sexually Abused: Not on file     OBSERVATIONS/OBJECTIVE:  BP 138/68 (BP Location: Left Arm, Patient Position: Sitting)   Pulse 69   Temp 98.2 F (36.8 C) (Temporal)   Resp 20   Ht 5' 9"  (1.753 m)   Wt 290 lb 6.4 oz (131.7 kg)   SpO2 100%   BMI 42.88 kg/m  GENERAL: Patient is a well appearing female in no acute distress HEENT:  Sclerae anicteric.  Oropharynx clear and moist. No ulcerations or evidence of oropharyngeal candidiasis. Neck is supple.  NODES:  No cervical, supraclavicular, or axillary lymphadenopathy palpated.   BREAST EXAM:  Right breast s/p lumpectomy, no sign of local recurrence, left breast benign LUNGS:  Clear to auscultation  bilaterally.  No wheezes or rhonchi. HEART:  Regular rate and rhythm. No murmur appreciated. ABDOMEN:  Soft, nontender.  Positive, normoactive bowel sounds. No organomegaly palpated. MSK:  No focal spinal tenderness to palpation. Full range of motion bilaterally in the upper extremities. EXTREMITIES:  No peripheral edema.   SKIN:  Clear with no obvious rashes or skin changes. No nail dyscrasia. NEURO:  Nonfocal. Well oriented.  Appropriate affect.    LABORATORY DATA:  None for this visit.  DIAGNOSTIC IMAGING:  None for this visit.      ASSESSMENT AND PLAN:  Ms.. Coyne is a pleasant 49 y.o. female with Stage IA right breast invasive lobular carcinoma, ER+/PR+/HER2-, diagnosed in 06/2019, treated with lumpectomy, adjuvant radiation therapy, and anti-estrogen therapy with Tamoxifen beginning in 09/2019.  She presents to the Survivorship Clinic for our initial meeting and routine follow-up post-completion of treatment for breast cancer.    1. Stage IA right breast cancer:  Ms. Mcauliffe is continuing to recover from definitive treatment for breast cancer. She will follow-up with her medical oncologist, Dr. Lindi Adie in 3 months with history and physical exam per surveillance protocol. I changed her script to tamoxifen from anastrozole today.  Her mammogram is due 05/2020; orders placed today. Today, a comprehensive survivorship care plan and treatment summary was reviewed with the patient today detailing her breast cancer diagnosis, treatment course, potential late/long-term effects of treatment, appropriate follow-up care with recommendations for the future, and patient education resources.  A copy of this summary, along with a letter will be sent to the patient's primary care provider via mail/fax/In Basket message after today's visit.    2. Hot flashes: I increased her Effexor  dose  3. Bone health:    She was given education on specific activities to promote bone health.  4. Cancer screening:  Due to Ms. Locurto history and her age, she should receive screening for skin cancers, colon cancer, and gynecologic cancers.  The information and recommendations are listed on the patient's comprehensive care plan/treatment summary and were reviewed in detail with the patient.    5. Health maintenance and wellness promotion: Ms. Handler was encouraged to consume 5-7 servings of fruits and vegetables per day. We reviewed the "Nutrition Rainbow" handout, as well as the handout "Take Control of Your Health and Reduce Your Cancer Risk" from the [facility].  She was also encouraged to engage in moderate to vigorous exercise for 30 minutes per day most days of the week. We discussed the [employer] fitness program, which is designed for cancer survivors to help them become more physically fit after cancer treatments.  She was instructed to limit her alcohol consumption and continue to abstain from tobacco use.     6. Support services/counseling: It is not uncommon for this period of the patient's cancer care trajectory to be one of many emotions and stressors.  We discussed how this can be increasingly difficult during the times of quarantine and social distancing due to the COVID-19 pandemic.   She was given information regarding our available services and encouraged to contact me with any questions or for help enrolling in any of our support group/programs.    Follow up instructions:    -Return to cancer center in 3 months for f/u with Dr. Lindi Adie  -Mammogram due in 05/2020 -She is welcome to return back to the Survivorship Clinic at any time; no additional follow-up needed at this time.  -Consider referral back to survivorship as a long-term survivor for  continued surveillance  The patient was provided an opportunity to ask questions and all were answered. The patient  agreed with the plan and demonstrated an understanding of the instructions.   Total encounter time: 45 minutes*  Wilber Bihari, NP 12/28/19 4:13 PM Medical Oncology and Hematology [facility] [place], [place] [address] Tel. [phone]    Fax. [phone]  *Total Encounter Time as defined by the Centers for Medicare and Medicaid Services includes, in addition to the face-to-face time of a patient visit (documented in the note above) non-face-to-face time: obtaining and reviewing outside history, ordering and reviewing medications, tests or procedures, care coordination (communications with other health care professionals or caregivers) and documentation in the medical record.

## 2019-12-28 NOTE — Progress Notes (Signed)
  Radiation Oncology         [phone] ________________________________  Name: Sandra Patterson MRN: [id number]  Date: 09/24/2019  DOB: Sep 28, 1971  End of Treatment Note  Diagnosis:   right-sided breast cancer     Indication for treatment:  Curative       Radiation treatment dates:   08/09/19 - 09/24/19  Site/dose:   The patient initially received a dose of 50.4 Gy in 28 fractions to the breast using whole-breast tangent fields. This was delivered using a 3-D conformal technique. The patient then received a boost to the seroma. This delivered an additional 10 Gy in 5 fractions using a 3-field photon boost. The total dose was 60.4 Gy.  Narrative: The patient tolerated radiation treatment relatively well.   The patient had some expected skin irritation as she progressed during treatment. Moist desquamation was not present at the end of treatment.  Plan: The patient has completed radiation treatment. The patient will return to radiation oncology clinic for routine followup in one month. I advised the patient to call or return sooner if they have any questions or concerns related to their recovery or treatment. ________________________________  Jodelle Gross, M.D., Ph.D.

## 2019-12-29 ENCOUNTER — Telehealth: Payer: Self-pay | Admitting: *Deleted

## 2019-12-29 NOTE — Telephone Encounter (Signed)
Sandra Bihari, NP, faxed pt mammogram results to [facility] in [place], [place].

## 2020-01-03 ENCOUNTER — Encounter (INDEPENDENT_AMBULATORY_CARE_PROVIDER_SITE_OTHER): Payer: Self-pay

## 2020-01-10 ENCOUNTER — Encounter (INDEPENDENT_AMBULATORY_CARE_PROVIDER_SITE_OTHER): Payer: Self-pay

## 2020-01-17 ENCOUNTER — Encounter (INDEPENDENT_AMBULATORY_CARE_PROVIDER_SITE_OTHER): Payer: Self-pay

## 2020-01-24 ENCOUNTER — Encounter (INDEPENDENT_AMBULATORY_CARE_PROVIDER_SITE_OTHER): Payer: Self-pay

## 2020-01-31 ENCOUNTER — Encounter (INDEPENDENT_AMBULATORY_CARE_PROVIDER_SITE_OTHER): Payer: Self-pay

## 2020-02-07 ENCOUNTER — Encounter (INDEPENDENT_AMBULATORY_CARE_PROVIDER_SITE_OTHER): Payer: Self-pay

## 2020-02-14 ENCOUNTER — Encounter (INDEPENDENT_AMBULATORY_CARE_PROVIDER_SITE_OTHER): Payer: Self-pay

## 2020-02-21 ENCOUNTER — Encounter (INDEPENDENT_AMBULATORY_CARE_PROVIDER_SITE_OTHER): Payer: Self-pay

## 2020-02-28 ENCOUNTER — Encounter (INDEPENDENT_AMBULATORY_CARE_PROVIDER_SITE_OTHER): Payer: Self-pay

## 2020-03-13 ENCOUNTER — Encounter (INDEPENDENT_AMBULATORY_CARE_PROVIDER_SITE_OTHER): Payer: Self-pay

## 2020-03-20 ENCOUNTER — Encounter (INDEPENDENT_AMBULATORY_CARE_PROVIDER_SITE_OTHER): Payer: Self-pay

## 2020-03-27 ENCOUNTER — Encounter (INDEPENDENT_AMBULATORY_CARE_PROVIDER_SITE_OTHER): Payer: Self-pay

## 2020-04-03 ENCOUNTER — Encounter (INDEPENDENT_AMBULATORY_CARE_PROVIDER_SITE_OTHER): Payer: Self-pay

## 2020-04-17 ENCOUNTER — Encounter (INDEPENDENT_AMBULATORY_CARE_PROVIDER_SITE_OTHER): Payer: Self-pay

## 2020-04-24 ENCOUNTER — Encounter (INDEPENDENT_AMBULATORY_CARE_PROVIDER_SITE_OTHER): Payer: Self-pay

## 2020-05-01 ENCOUNTER — Encounter (INDEPENDENT_AMBULATORY_CARE_PROVIDER_SITE_OTHER): Payer: Self-pay

## 2020-06-05 ENCOUNTER — Encounter (INDEPENDENT_AMBULATORY_CARE_PROVIDER_SITE_OTHER): Payer: Self-pay

## 2021-02-20 ENCOUNTER — Other Ambulatory Visit: Payer: Self-pay | Admitting: Adult Health

## 2021-02-20 DIAGNOSIS — C50411 Malignant neoplasm of upper-outer quadrant of right female breast: Secondary | ICD-10-CM

## 2021-04-16 ENCOUNTER — Other Ambulatory Visit: Payer: Self-pay | Admitting: Adult Health

## 2021-04-16 DIAGNOSIS — Z170 Estrogen receptor positive status [ER+]: Secondary | ICD-10-CM

## 2021-04-16 DIAGNOSIS — C50411 Malignant neoplasm of upper-outer quadrant of right female breast: Secondary | ICD-10-CM

## 2021-04-18 ENCOUNTER — Inpatient Hospital Stay: Admitting: Hematology and Oncology

## 2021-04-30 NOTE — Progress Notes (Signed)
Patient Care Team: [name], FNP as PCP - General (Family Medicine) [name], MD as Consulting Physician (Radiation Oncology) [name], MD as Consulting Physician (Hematology and Oncology) [name], MD as Consulting Physician (General Surgery)  DIAGNOSIS:    ICD-10-CM   1. Malignant neoplasm of upper-outer quadrant of right breast in female, estrogen receptor positive ([place])  C50.411    Z17.0       SUMMARY OF ONCOLOGIC HISTORY: Oncology History  Malignant neoplasm of upper-outer quadrant of right breast in female, estrogen receptor positive ([place])  06/23/2019 Initial Diagnosis   Routine screening mammogram detected an 0.8cm mass in the right breast at the 9:00 position 1 cm from the nipple. 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    06/23/2019 Cancer Staging   Staging form: Breast, AJCC 8th Edition - Clinical stage from 06/23/2019: Stage IA (cT1b, cN0, cM0, G2, ER+, PR+, HER2-) - Signed by [name], NP on 06/30/2019    07/02/2019 Surgery   Right lumpectomy [name]) [phone]): invasive lobular carcinoma with LCIS, 1.4cm, grade 2, clear margins. ER positive, PR positive, HER-2 negative, Ki67 30%. Isolated tumor cells present in 1 of 2 lymph nodes.    07/02/2019 Cancer Staging   Staging form: Breast, AJCC 8th Edition - Pathologic stage from 07/02/2019: Stage IA (pT1c, pN0(i+), cM0, G2, ER+, PR+, HER2-) - Signed by [name], MD on 07/12/2019    07/16/2019 Oncotype testing   The Oncotype DX score was 21 predicting a risk of outside the breast recurrence over the next 9 years of 7% if the patient's only systemic therapy is tamoxifen for 5 years.    08/10/2019 - 09/24/2019 Radiation Therapy   The patient initially received a dose of 50.4 Gy in 28 fractions to the breast using whole-breast tangent fields. This was delivered using a 3-D conformal technique. The pt received a boost delivering an additional 10 Gy  in 5 fractions using a electron boost with 5mV electrons. The total dose was 60.4 Gy.    08/25/2019 Genetic Testing   Negative genetic testing on the CancerNext-Expanded+RNAinsight.  The CancerNext-Expanded gene panel [facility] by [facility] includes sequencing and rearrangement analysis for the following 67 genes: AIP, ALK, APC*, ATM*, BAP1, BARD1, BLM, BMPR1A, BRCA1*, BRCA2*, BRIP1*, CDH1*, CDK4, CDKN1B, CDKN2A, CHEK2*, DICER1, FANCC, FH, FLCN, GALNT12, HOXB13, MAX, MEN1, MET, MLH1*, MRE11A, MSH2*, MSH6*, MUTYH*, NBN, NF1*, NF2, PALB2*, PHOX2B, PMS2*, POLD1, POLE, POT1, PRKAR1A, PTCH1, PTEN*, RAD50, RAD51C*, RAD51D*, RB1, RET, SDHA, SDHAF2, SDHB, SDHC, SDHD, SMAD4, SMARCA4, SMARCB1, SMARCE1, STK11, SUFU, TMEM127, TP53*, TSC1, TSC2, VHL and XRCC2 (sequencing and deletion/duplication); MITF (sequencing only); EPCAM and GREM1 (deletion/duplication only). DNA and RNA analyses performed for * genes. The report date is August 25, 2019.   09/2019 - 09/2029 Anti-estrogen oral therapy   Tamoxifen     CHIEF COMPLIANT: Follow-up for breast cancer  INTERVAL HISTORY: TKathrina Crosleyis a 50y.o. with above-mentioned history of breast cancer who underwent a lumpectomy and completed radiation therapy. She presents to the clinic today for a survivorship care plan visit.  She is tolerating tamoxifen reasonably well.  Her major complaint is hot flashes.  They are moderate in severity.  She is currently on Effexor for the hot flashes 25 mg daily.  She does not think it is helping her significantly.  However she is able to handle it and tolerated.  She is taking 10 mg of tamoxifen daily.  ALLERGIES:  is allergic to codeine.  MEDICATIONS:  Current Outpatient Medications  Medication Sig Dispense Refill   amLODipine (NORVASC) 5 MG tablet Take 5 mg by mouth daily.     atorvastatin (LIPITOR) 40 MG tablet Take 40 mg by mouth daily.     benazepril (LOTENSIN) 40 MG tablet Take 40 mg by mouth 2 (two) times daily.       carvedilol (COREG) 25 MG tablet Take 25 mg by mouth 2 (two) times daily with a meal.     famotidine (PEPCID) 20 MG tablet TAKE 1 TABLET BY MOUTH ONCE DAILY IN THE MORNING     ferrous sulfate 325 (65 FE) MG tablet Take 325 mg by mouth 2 (two) times daily.      hydroxychloroquine (PLAQUENIL) 200 MG tablet Take 200 mg by mouth 2 (two) times daily.      predniSONE (DELTASONE) 10 MG tablet Take 10 mg by mouth daily.     tamoxifen (NOLVADEX) 10 MG tablet Take 1 tablet by mouth twice daily 60 tablet 0   venlafaxine XR (EFFEXOR-XR) 75 MG 24 hr capsule TAKE 1 CAPSULE BY MOUTH DAILY WITH BREAKFAST 90 capsule 0   Vitamin D, Ergocalciferol, (DRISDOL) 1.25 MG (50000 UT) CAPS capsule Take 50,000 Units by mouth every Friday.      No current facility-administered medications for this visit.    PHYSICAL EXAMINATION: ECOG PERFORMANCE STATUS: 1 - Symptomatic but completely ambulatory  Vitals:   05/01/21 1349  BP: 129/61  Pulse: 60  Resp: 18  Temp: 97.7 F (36.5 C)  SpO2: 97%   Filed Weights   05/01/21 1349  Weight: 279 lb (126.6 kg)    BREAST: No palpable masses or nodules in either right or left breasts. No palpable axillary supraclavicular or infraclavicular adenopathy no breast tenderness or nipple discharge. (exam performed in the presence of a chaperone)  LABORATORY DATA:  I have reviewed the data as listed CMP Latest Ref Rng & Units 06/29/2019  Glucose 70 - 99 mg/dL 124(H)  BUN 6 - 20 mg/dL 9  Creatinine 0.44 - 1.00 mg/dL 1.53(H)  Sodium 135 - 145 mmol/L 135  Potassium 3.5 - 5.1 mmol/L 3.4(L)  Chloride 98 - 111 mmol/L 101  CO2 22 - 32 mmol/L 24  Calcium 8.9 - 10.3 mg/dL 9.8    Lab Results  Component Value Date   WBC 7.2 06/29/2019   HGB 15.5 (H) 06/29/2019   HCT 47.5 (H) 06/29/2019   MCV 95.6 06/29/2019   PLT 175 06/29/2019    ASSESSMENT & PLAN:  Malignant neoplasm of upper-outer quadrant of right breast in female, estrogen receptor positive ([place]) 06/23/2019:Routine  screening mammogram detected an 0.8cm mass in the right breast at the 9:00 position 1 cm from the nipple. 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  T1CN0 stage Ia clinical stage History of rheumatoid arthritis and lupus and hypertension   Treatment plan: 1. Breast conserving surgery:Right lumpectomy ([place]): invasive lobular carcinoma with LCIS, 1.4cm, grade 2, clear margins, and isolated tumor cells present in 1 of 2 lymph nodes. 2. Oncotype DX testing: Recurrence score 21, recommended chemotherapy but patient refused because of small benefit 3. Adjuvant radiation therapy 08/10/2019-09/24/2019 4. Adjuvant antiestrogen therapy with tamoxifen to start 10/11/2019 ---------------------------------------------------------------------------------------------------------------- Treatment plan: Adjuvant antiestrogen therapy with tamoxifen 20 mg daily x10 years Tamoxifen toxicities: Hot flashes moderate in severity: Takes Effexor which helps her somewhat. I sent a years worth of refills of tamoxifen. Breast cancer surveillance: 1.  Breast exam 05/01/2021: Benign 2. Mammogram: This will  result done at imaging center in [place].  We will call them and get a copy of this report.  Return to clinic in 1 year for follow-up    No orders of the defined types were placed in this encounter.  The patient has a good understanding of the overall plan. she agrees with it. she will call with any problems that may develop before the next visit here.  Total time spent: 20 mins including face to face time and time spent for planning, charting and coordination of care  Rulon Eisenmenger, MD, MPH 05/01/2021  I, Thana Ates, am acting as scribe for Dr. [name].  I have reviewed the above documentation for accuracy and completeness, and I agree with the above.

## 2021-05-01 ENCOUNTER — Other Ambulatory Visit: Payer: Self-pay

## 2021-05-01 ENCOUNTER — Inpatient Hospital Stay: Attending: Hematology and Oncology | Admitting: Hematology and Oncology

## 2021-05-01 DIAGNOSIS — Z170 Estrogen receptor positive status [ER+]: Secondary | ICD-10-CM | POA: Diagnosis not present

## 2021-05-01 DIAGNOSIS — Z923 Personal history of irradiation: Secondary | ICD-10-CM | POA: Insufficient documentation

## 2021-05-01 DIAGNOSIS — N951 Menopausal and female climacteric states: Secondary | ICD-10-CM | POA: Diagnosis not present

## 2021-05-01 DIAGNOSIS — C50411 Malignant neoplasm of upper-outer quadrant of right female breast: Secondary | ICD-10-CM | POA: Diagnosis not present

## 2021-05-01 MED ORDER — TAMOXIFEN CITRATE 10 MG PO TABS: 10.0000 mg | ORAL_TABLET | Freq: Every day | ORAL | 3 refills | Status: DC

## 2021-05-01 MED ORDER — ATORVASTATIN CALCIUM 80 MG PO TABS: 80.0000 mg | ORAL_TABLET | Freq: Every day | ORAL | Status: DC

## 2021-05-01 MED ORDER — AMLODIPINE BESYLATE 10 MG PO TABS: 10.0000 mg | ORAL_TABLET | Freq: Every day | ORAL | Status: DC

## 2021-05-01 NOTE — Assessment & Plan Note (Signed)
06/23/2019:Routine screening mammogram detected an 0.8cm mass in the right breast at the 9:00 position 1 cm from the nipple. 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 T1CN0 stage Ia clinical stage History of rheumatoid arthritis and lupus and hypertension  Treatment plan: 1. Breast conserving surgery:Right lumpectomy (Tsuei): invasive lobular carcinoma with LCIS, 1.4cm, grade 2, clear margins, and isolated tumor cells present in 1 of 2 lymph nodes. 2. Oncotype DX testing: Recurrence score 21, recommended chemotherapy but patient refused because of small benefit 3. Adjuvant radiation therapy 08/10/2019-09/24/2019 4. Adjuvant antiestrogen therapy with tamoxifen to start 10/11/2019 ---------------------------------------------------------------------------------------------------------------- Treatment plan: Adjuvant antiestrogen therapy with tamoxifen 20 mg daily x10 years Tamoxifen toxicities:  Breast cancer surveillance: 1.  Breast exam 05/01/2021: Benign 2. Mammogram  Return to clinic in 1 year for follow-up

## 2021-05-02 ENCOUNTER — Telehealth: Payer: Self-pay | Admitting: Hematology and Oncology

## 2021-05-02 NOTE — Telephone Encounter (Signed)
Scheduled appointment per 06/21 los. Patient is aware. 

## 2021-06-09 ENCOUNTER — Other Ambulatory Visit: Payer: Self-pay | Admitting: Hematology and Oncology

## 2021-06-09 DIAGNOSIS — Z170 Estrogen receptor positive status [ER+]: Secondary | ICD-10-CM

## 2021-08-29 ENCOUNTER — Other Ambulatory Visit: Payer: Self-pay | Admitting: Hematology and Oncology

## 2021-08-29 DIAGNOSIS — C50411 Malignant neoplasm of upper-outer quadrant of right female breast: Secondary | ICD-10-CM

## 2021-08-29 DIAGNOSIS — Z170 Estrogen receptor positive status [ER+]: Secondary | ICD-10-CM

## 2021-09-20 ENCOUNTER — Other Ambulatory Visit: Payer: Self-pay | Admitting: Hematology and Oncology

## 2021-09-20 DIAGNOSIS — C50411 Malignant neoplasm of upper-outer quadrant of right female breast: Secondary | ICD-10-CM

## 2021-09-20 DIAGNOSIS — Z170 Estrogen receptor positive status [ER+]: Secondary | ICD-10-CM

## 2021-11-10 ENCOUNTER — Other Ambulatory Visit: Payer: Self-pay | Admitting: Hematology and Oncology

## 2021-11-10 DIAGNOSIS — C50411 Malignant neoplasm of upper-outer quadrant of right female breast: Secondary | ICD-10-CM

## 2021-11-10 DIAGNOSIS — Z170 Estrogen receptor positive status [ER+]: Secondary | ICD-10-CM

## 2021-12-16 ENCOUNTER — Encounter (HOSPITAL_COMMUNITY): Payer: Self-pay | Admitting: Emergency Medicine

## 2021-12-16 ENCOUNTER — Other Ambulatory Visit: Payer: Self-pay

## 2021-12-16 ENCOUNTER — Emergency Department (HOSPITAL_COMMUNITY)
Admission: EM | Admit: 2021-12-16 | Discharge: 2021-12-16 | Disposition: A | Discharge status: Home / Self Care | Attending: Emergency Medicine | Admitting: Emergency Medicine

## 2021-12-16 ENCOUNTER — Emergency Department (HOSPITAL_COMMUNITY)

## 2021-12-16 DIAGNOSIS — Z79899 Other long term (current) drug therapy: Secondary | ICD-10-CM | POA: Insufficient documentation

## 2021-12-16 DIAGNOSIS — R0789 Other chest pain: Secondary | ICD-10-CM | POA: Diagnosis not present

## 2021-12-16 DIAGNOSIS — R079 Chest pain, unspecified: Secondary | ICD-10-CM | POA: Diagnosis present

## 2021-12-16 DIAGNOSIS — F17200 Nicotine dependence, unspecified, uncomplicated: Secondary | ICD-10-CM | POA: Diagnosis not present

## 2021-12-16 DIAGNOSIS — E1122 Type 2 diabetes mellitus with diabetic chronic kidney disease: Secondary | ICD-10-CM | POA: Insufficient documentation

## 2021-12-16 DIAGNOSIS — I129 Hypertensive chronic kidney disease with stage 1 through stage 4 chronic kidney disease, or unspecified chronic kidney disease: Secondary | ICD-10-CM | POA: Insufficient documentation

## 2021-12-16 DIAGNOSIS — Z853 Personal history of malignant neoplasm of breast: Secondary | ICD-10-CM | POA: Diagnosis not present

## 2021-12-16 DIAGNOSIS — R1084 Generalized abdominal pain: Secondary | ICD-10-CM | POA: Insufficient documentation

## 2021-12-16 DIAGNOSIS — N189 Chronic kidney disease, unspecified: Secondary | ICD-10-CM | POA: Diagnosis not present

## 2021-12-16 LAB — COMPREHENSIVE METABOLIC PANEL
ALT: 17 U/L (ref 0–44)
AST: 17 U/L (ref 15–41)
Albumin: 3.7 g/dL (ref 3.5–5.0)
Alkaline Phosphatase: 55 U/L (ref 38–126)
Anion gap: 8 (ref 5–15)
BUN: 10 mg/dL (ref 6–20)
CO2: 26 mmol/L (ref 22–32)
Calcium: 9.6 mg/dL (ref 8.9–10.3)
Chloride: 106 mmol/L (ref 98–111)
Creatinine, Ser: 1.2 mg/dL — ABNORMAL HIGH (ref 0.44–1.00)
GFR, Estimated: 56 mL/min — ABNORMAL LOW (ref >60)
Glucose, Bld: 165 mg/dL — ABNORMAL HIGH (ref 70–99)
Potassium: 3.3 mmol/L — ABNORMAL LOW (ref 3.5–5.1)
Sodium: 140 mmol/L (ref 135–145)
Total Bilirubin: 0.5 mg/dL (ref 0.3–1.2)
Total Protein: 6.8 g/dL (ref 6.5–8.1)

## 2021-12-16 LAB — CBC
HCT: 40 % (ref 36.0–46.0)
Hemoglobin: 12.7 g/dL (ref 12.0–15.0)
MCH: 29 pg (ref 26.0–34.0)
MCHC: 31.8 g/dL (ref 30.0–36.0)
MCV: 91.3 fL (ref 80.0–100.0)
Platelets: 230 10*3/uL (ref 150–400)
RBC: 4.38 MIL/uL (ref 3.87–5.11)
RDW: 13.5 % (ref 11.5–15.5)
WBC: 4.7 10*3/uL (ref 4.0–10.5)
nRBC: 0 % (ref 0.0–0.2)

## 2021-12-16 LAB — LIPASE, BLOOD: Lipase: 46 U/L (ref 11–51)

## 2021-12-16 LAB — I-STAT BETA HCG BLOOD, ED (MC, WL, AP ONLY): I-stat hCG, quantitative: <5 m[IU]/mL (ref <5)

## 2021-12-16 LAB — TROPONIN I (HIGH SENSITIVITY): Troponin I (High Sensitivity): 8 ng/L (ref <18)

## 2021-12-16 MED ORDER — ALUM & MAG HYDROXIDE-SIMETH 200-200-20 MG/5ML PO SUSP
30.0000 mL | Freq: Once | ORAL | Status: AC
  Administered 2021-12-16: 30 mL via ORAL
  Filled 2021-12-16: qty 30

## 2021-12-16 MED ORDER — LIDOCAINE VISCOUS HCL 2 % MT SOLN
15.0000 mL | Freq: Once | OROMUCOSAL | Status: AC
  Administered 2021-12-16: 15 mL via ORAL
  Filled 2021-12-16: qty 15

## 2021-12-16 MED ORDER — ONDANSETRON 4 MG PO TBDP
4.0000 mg | ORAL_TABLET | Freq: Once | ORAL | Status: AC
  Administered 2021-12-16: 4 mg via ORAL
  Filled 2021-12-16: qty 1

## 2021-12-16 MED ORDER — MORPHINE SULFATE (PF) 4 MG/ML IV SOLN
4.0000 mg | Freq: Once | INTRAVENOUS | Status: AC
  Administered 2021-12-16: 4 mg via INTRAMUSCULAR
  Filled 2021-12-16: qty 1

## 2021-12-16 MED ORDER — POTASSIUM CHLORIDE CRYS ER 20 MEQ PO TBCR
40.0000 meq | EXTENDED_RELEASE_TABLET | Freq: Once | ORAL | Status: AC
  Administered 2021-12-16: 40 meq via ORAL
  Filled 2021-12-16: qty 2

## 2021-12-16 NOTE — ED Triage Notes (Signed)
Patient c/o chest pain x 1 week. Describes chest pain as central and pressure. Radiates under right breast and into abdomen. States pain is worse when laying down and with activity. Reports shortness of breath when lying down.

## 2021-12-16 NOTE — ED Provider Notes (Signed)
[facility] EMERGENCY DEPARTMENT Provider Note   CSN: [id number] Arrival date & time: 12/16/21  1620     History  Chief Complaint  Patient presents with   Chest Pain    Sandra Patterson is a 51 y.o. female.   Chest Pain Patient is a 51 year old female with past medical history significant for reflux, anemia, HTN, breast cancer now completely remission, DM2, CKD rheumatoid arthritis and lupus  Patient is presented to emergency room with complaints of intermittent pain underneath her breasts in her epigastrium her abdomen and in her right axilla for approximately 1 week.  She describes it as an achy pressure she denies any sharp or pleuritic pains.  She states that it seems to be worse if she lays down sometimes worse with movements but not with exertion such as walking stairs or walking for long peers of time.  She states she occasionally feels somewhat short of breath when she is laying down but denies any shortness of breath otherwise.  Denies any hemoptysis unilateral or bilateral leg swelling denies any oral contraceptive use and denies any bruising or bleeding.  Denies any nausea vomiting or diarrhea. No diaphoresis.  No other associated symptoms.     Home Medications Prior to Admission medications   Medication Sig Start Date End Date Taking? Authorizing Provider  amLODipine (NORVASC) 10 MG tablet Take 1 tablet (10 mg total) by mouth daily. 05/01/21   [name], MD  atorvastatin (LIPITOR) 80 MG tablet Take 1 tablet (80 mg total) by mouth daily. 05/01/21   [name], MD  benazepril (LOTENSIN) 40 MG tablet Take 40 mg by mouth 2 (two) times daily.     [provider]  carvedilol (COREG) 25 MG tablet Take 25 mg by mouth 2 (two) times daily with a meal.    [provider]  famotidine (PEPCID) 20 MG tablet TAKE 1 TABLET BY MOUTH ONCE DAILY IN THE MORNING 05/07/19   [provider]  tamoxifen (NOLVADEX) 10 MG tablet Take 1 tablet by mouth  twice daily 11/13/21   [name], MD  venlafaxine XR (EFFEXOR-XR) 75 MG 24 hr capsule TAKE 1 CAPSULE BY MOUTH ONCE DAILY WITH BREAKFAST 09/20/21   [name], MD      Allergies    Codeine    Review of Systems   Review of Systems  Cardiovascular:  Positive for chest pain.   Physical Exam Updated Vital Signs BP (!) 123/58 (BP Location: Left Arm)    Pulse 62    Temp 98.4 F (36.9 C) (Oral)    Resp 10    Ht 5\' 9"  (1.753 m)    Wt 127 kg    LMP 05/18/2019 (Approximate)    SpO2 98%    BMI 41.35 kg/m  Physical Exam Vitals and nursing note reviewed.  Constitutional:      General: She is not in acute distress.    Appearance: She is obese.     Comments: Pleasant well-appearing 51 year old.  In no acute distress.  Sitting comfortably in bed.  Able answer questions appropriately follow commands. No increased work of breathing. Speaking in full sentences.   HENT:     Head: Normocephalic and atraumatic.     Nose: Nose normal.     Mouth/Throat:     Mouth: Mucous membranes are moist.  Eyes:     General: No scleral icterus. Cardiovascular:     Rate and Rhythm: Normal rate and regular rhythm.     Pulses: Normal pulses.  Heart sounds: Normal heart sounds.  Pulmonary:     Effort: Pulmonary effort is normal. No respiratory distress.     Breath sounds: No wheezing.  Abdominal:     Palpations: Abdomen is soft.     Tenderness: There is abdominal tenderness. There is no guarding or rebound.     Comments: Some mild epigastric tenderness palpation  Musculoskeletal:     Cervical back: Normal range of motion.     Right lower leg: No edema.     Left lower leg: No edema.  Skin:    General: Skin is warm and dry.     Capillary Refill: Capillary refill takes less than 2 seconds.  Neurological:     Mental Status: She is alert. Mental status is at baseline.  Psychiatric:        Mood and Affect: Mood normal.        Behavior: Behavior normal.    ED Results / Procedures / Treatments    Labs (all labs ordered are listed, but only abnormal results are displayed) Labs Reviewed  COMPREHENSIVE METABOLIC PANEL - Abnormal; Notable for the following components:      Result Value   Potassium 3.3 (*)    Glucose, Bld 165 (*)    Creatinine, Ser 1.20 (*)    GFR, Estimated 56 (*)    All other components within normal limits  CBC  LIPASE, BLOOD  I-STAT BETA HCG BLOOD, ED ([facility], [facility], [facility] ONLY)  TROPONIN I (HIGH SENSITIVITY)  TROPONIN I (HIGH SENSITIVITY)    EKG EKG Interpretation  Date/Time:  Sunday December 16 2021 17:51:04 EST Ventricular Rate:  65 PR Interval:  181 QRS Duration: 97 QT Interval:  418 QTC Calculation: 435 R Axis:   27 Text Interpretation: Sinus rhythm Abnormal inferior Q waves No significant change since last tracing Confirmed by Isla Pence [phone]) on 12/16/2021 5:59:24 PM  Radiology DG Chest 2 View  Result Date: 12/16/2021 CLINICAL DATA:  Chest pain and shortness of breath for 1 week EXAM: CHEST - 2 VIEW COMPARISON:  None. FINDINGS: The heart size and mediastinal contours are within normal limits. Both lungs are clear. The visualized skeletal structures are unremarkable. Calcification of the aorta. IMPRESSION: No active cardiopulmonary disease. Electronically Signed   By: Lucienne Capers M.D.   On: 12/16/2021 17:48    Procedures Procedures    Medications Ordered in ED Medications  alum & mag hydroxide-simeth (MAALOX/MYLANTA) 200-200-20 MG/5ML suspension 30 mL (30 mLs Oral Given 12/16/21 1751)    And  lidocaine (XYLOCAINE) 2 % viscous mouth solution 15 mL (15 mLs Oral Given 12/16/21 1751)  morphine (PF) 4 MG/ML injection 4 mg (4 mg Intramuscular Given 12/16/21 1859)  ondansetron (ZOFRAN-ODT) disintegrating tablet 4 mg (4 mg Oral Given 12/16/21 1858)  potassium chloride SA (KLOR-CON M) CR tablet 40 mEq (40 mEq Oral Given 12/16/21 1858)    ED Course/ Medical Decision Making/ A&P                           Medical Decision Making Amount and/or Complexity of  Data Reviewed Labs: ordered. Radiology: ordered.  Risk OTC drugs. Prescription drug management.  This patient presents to the ED for concern of chest pain, this involves a number of treatment options, and is a complaint that carries with it a high risk of complications and morbidity.  The differential diagnosis includes The emergent causes of chest pain include: Acute coronary syndrome, tamponade, pericarditis/myocarditis, aortic dissection, pulmonary embolism, tension  pneumothorax, pneumonia, and esophageal rupture.  She also describes some epigastric/generalized abd pain  The causes of generalized abdominal pain include but are not limited to AAA, mesenteric ischemia, appendicitis, diverticulitis, DKA, gastritis, gastroenteritis, AMI, nephrolithiasis, pancreatitis, peritonitis, adrenal insufficiency,lead poisoning, iron toxicity, intestinal ischemia, constipation, UTI,SBO/LBO, splenic rupture, biliary disease, IBD, IBS, PUD, or hepatitis, Ectopic pregnancy, ovarian torsion, PID.   Co morbidities: Discussed in HPI   Brief History:  Patient with very atypical nonexertional chest pain.  Does have some risk factors for them ACS given obesity, smoking history, HTN, HLD DM2  Patient is also PERC negative.  Notably she is currently using tamoxifen however she has been on this for quite some time is in complete remission from her breast cancer.  I have a very low suspicion for PE.   Provided with 1 dose of morphine as well as some Zofran, GI cocktail and potassium.  After GI cocktail--before morphine--patient's chest pain completely resolved she is now complaining primarily of some epigastric pain which prompted me to give her 1 dose of morphine.  On reassessment patient now with no pain.  Continues to have no abdominal tenderness.  She feels well.  I discussed further testing with her including a second cardiac enzyme which she declined.  Her symptoms been intermittent for a full week he is  reasonable to hold off on this however I will recommend that she follow-up with her cardiologist.  She assures me she only sees cardiologist because she was told to follow-up with them for general screening after she was diagnosed with cancer some years ago.  She states she is in complete remission but continues to follow with cardiology.  EMR reviewed including pt PMHx, past surgical history and past visits to ER.   See HPI for more details   Lab Tests:  I ordered and independently interpreted labs.  The pertinent results include:    Labs notable for mild hypokalemia repleted with p.o. potassium.  Kidney function at baseline.  Lipase is normal meds.  CBC without leukocytosis troponin within normal notes x1.  I-STAT hCG negative for pregnancy.   Imaging Studies:  I independently reviewed chest x-ray agree of radiology no acute disease.    Cardiac Monitoring:  The patient was maintained on a cardiac monitor.  I personally viewed and interpreted the cardiac monitored which showed an underlying rhythm of: Normal sinus rhythm Nonischemic.    Medicines ordered:  I ordered medication including morphine, Zofran, potassium, GI cocktail for chest pain, epigastric pain Reevaluation of the patient after these medicines showed that the patient improved I have reviewed the patients home medicines and have made adjustments as needed   Critical Interventions:    Consults:    Reevaluation:  After the interventions noted above I re-evaluated patient and found that they have :resolved   Social Determinants of Health:  The patient's social determinants of health were a factor in the care of this patient    Problem List / ED Course:  Atypical chest pain, epigastric pain    Lengthy shared decision-making oversedation with patient he states that she preferred to be discharged home with follow-up with cardiology and PCP.  I did recommend 2 troponins however she declined and would  like to go home now.  She assures me that should her symptoms return she will come back to the emergency room and she will follow closely with cardiology.   Dispostion:  After consideration of the diagnostic results and the patients response to treatment, I feel that  the patent would benefit from close outpatient follow-up with cardiology and PCP.  Strict return precautions were given.   The patient has any new or concerning symptoms she will return immediately to the ER.  Otherwise we will continue to closely monitor symptoms and continue following her with her outpatient team.   Final Clinical Impression(s) / ED Diagnoses Final diagnoses:  Atypical chest pain    Rx / DC Orders ED Discharge Orders     None         Tedd Sias, [place] 12/16/21 2337    Isla Pence, MD 12/16/21 2357

## 2021-12-16 NOTE — ED Notes (Signed)
Pt transported to Xray via stretcher

## 2021-12-16 NOTE — Discharge Instructions (Addendum)
Please follow-up with your cardiologist and your primary care doctor within the next week.  Your potassium was very marginally low at 3.3.  You were given repletion here.  Please continue to take the famotidine/Pepcid that you are prescribed every day.  Drink plenty of water I recommend refraining from ibuprofen.  You may also try taking some Tums when you are having chest pain.  If you have any new or concerning symptoms you may return to the emergency room immediately.  As we discussed to cardiac enzymes are helpful for making sure that you are not at risk however given that your symptoms been going on for 1 week intermittently and your initial work-up has been reassuring I think very reasonable to follow-up with your cardiologist since you would like to be checked out at this time.  Please remember that you may always return to the ER.

## 2021-12-16 NOTE — ED Notes (Signed)
RN reviewed discharge instructions w/ pt. Follow up and medications reviewed, pt had no further questions °

## 2022-01-23 ENCOUNTER — Other Ambulatory Visit: Payer: Self-pay | Admitting: Hematology and Oncology

## 2022-01-23 DIAGNOSIS — C50411 Malignant neoplasm of upper-outer quadrant of right female breast: Secondary | ICD-10-CM

## 2022-03-10 ENCOUNTER — Other Ambulatory Visit: Payer: Self-pay | Admitting: Hematology and Oncology

## 2022-03-10 DIAGNOSIS — Z170 Estrogen receptor positive status [ER+]: Secondary | ICD-10-CM

## 2022-04-30 ENCOUNTER — Ambulatory Visit: Admitting: Hematology and Oncology

## 2022-05-03 ENCOUNTER — Telehealth: Payer: Self-pay | Admitting: Hematology and Oncology

## 2022-05-09 ENCOUNTER — Ambulatory Visit: Admitting: Hematology and Oncology

## 2022-05-21 ENCOUNTER — Other Ambulatory Visit: Payer: Self-pay

## 2022-05-21 ENCOUNTER — Inpatient Hospital Stay: Attending: Hematology and Oncology | Admitting: Hematology and Oncology

## 2022-05-21 DIAGNOSIS — N951 Menopausal and female climacteric states: Secondary | ICD-10-CM | POA: Insufficient documentation

## 2022-05-21 DIAGNOSIS — R232 Flushing: Secondary | ICD-10-CM | POA: Diagnosis not present

## 2022-05-21 DIAGNOSIS — C50411 Malignant neoplasm of upper-outer quadrant of right female breast: Secondary | ICD-10-CM | POA: Insufficient documentation

## 2022-05-21 DIAGNOSIS — Z170 Estrogen receptor positive status [ER+]: Secondary | ICD-10-CM | POA: Diagnosis present

## 2022-05-21 DIAGNOSIS — Z923 Personal history of irradiation: Secondary | ICD-10-CM | POA: Diagnosis not present

## 2022-05-21 NOTE — Assessment & Plan Note (Addendum)
06/23/2019:Routine screening mammogram detected an 0.8cm mass in the right breast at the 9:00 position 1 cm from the nipple. 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 T1CN0 stage Ia clinical stage History of rheumatoid arthritis and lupus and hypertension  Treatment plan: 1. Breast conserving surgery:Right lumpectomy (Tsuei): invasive lobular carcinoma with LCIS, 1.4cm, grade 2, clear margins, and isolated tumor cells present in 1 of 2 lymph nodes. 2. Oncotype DX testing: Recurrence score 21, recommended chemotherapy but patient refused because of small benefit 3. Adjuvant radiation therapy9/29/2020-09/24/2019 4. Adjuvant antiestrogen therapywith tamoxifen to start 10/11/2019 ---------------------------------------------------------------------------------------------------------------- Treatment plan: Adjuvant antiestrogen therapy with tamoxifen 20 mg daily x10 years Tamoxifen toxicities: Hot flashes moderate in severity: Takes Effexor which helps her somewhat. I sent a years worth of refills of tamoxifen. Breast cancer surveillance: 1.  Breast exam 05/21/2022: Benign 2. Mammogram: 01/15/2021 [facility].  Benign breast density category B   Return to clinic in 1 year for follow-up

## 2022-05-21 NOTE — Progress Notes (Signed)
Patient Care Team: [name], FNP as PCP - General (Family Medicine) [name], MD as Consulting Physician (Radiation Oncology) [name], MD as Consulting Physician (Hematology and Oncology) [name], MD as Consulting Physician (General Surgery)  DIAGNOSIS:  Encounter Diagnosis  Name Primary?   Malignant neoplasm of upper-outer quadrant of right breast in female, estrogen receptor positive ([place])     SUMMARY OF ONCOLOGIC HISTORY: Oncology History  Malignant neoplasm of upper-outer quadrant of right breast in female, estrogen receptor positive ([place])  06/23/2019 Initial Diagnosis   Routine screening mammogram detected an 0.8cm mass in the right breast at the 9:00 position 1 cm from the nipple. 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    06/23/2019 Cancer Staging   Staging form: Breast, AJCC 8th Edition - Clinical stage from 06/23/2019: Stage IA (cT1b, cN0, cM0, G2, ER+, PR+, HER2-) - Signed by [name], NP on 06/30/2019   07/02/2019 Surgery   Right lumpectomy [name]) [phone]): invasive lobular carcinoma with LCIS, 1.4cm, grade 2, clear margins. ER positive, PR positive, HER-2 negative, Ki67 30%. Isolated tumor cells present in 1 of 2 lymph nodes.    07/02/2019 Cancer Staging   Staging form: Breast, AJCC 8th Edition - Pathologic stage from 07/02/2019: Stage IA (pT1c, pN0(i+), cM0, G2, ER+, PR+, HER2-) - Signed by [name], MD on 07/12/2019   07/16/2019 Oncotype testing   The Oncotype DX score was 21 predicting a risk of outside the breast recurrence over the next 9 years of 7% if the patient's only systemic therapy is tamoxifen for 5 years.    08/10/2019 - 09/24/2019 Radiation Therapy   The patient initially received a dose of 50.4 Gy in 28 fractions to the breast using whole-breast tangent fields. This was delivered using a 3-D conformal technique. The pt received a boost delivering an additional 10 Gy in 5  fractions using a electron boost with 60mV electrons. The total dose was 60.4 Gy.    08/25/2019 Genetic Testing   Negative genetic testing on the CancerNext-Expanded+RNAinsight.  The CancerNext-Expanded gene panel [facility] includes sequencing and rearrangement analysis for the following 67 genes: AIP, ALK, APC*, ATM*, BAP1, BARD1, BLM, BMPR1A, BRCA1*, BRCA2*, BRIP1*, CDH1*, CDK4, CDKN1B, CDKN2A, CHEK2*, DICER1, FANCC, FH, FLCN, GALNT12, HOXB13, MAX, MEN1, MET, MLH1*, MRE11A, MSH2*, MSH6*, MUTYH*, NBN, NF1*, NF2, PALB2*, PHOX2B, PMS2*, POLD1, POLE, POT1, PRKAR1A, PTCH1, PTEN*, RAD50, RAD51C*, RAD51D*, RB1, RET, SDHA, SDHAF2, SDHB, SDHC, SDHD, SMAD4, SMARCA4, SMARCB1, SMARCE1, STK11, SUFU, TMEM127, TP53*, TSC1, TSC2, VHL and XRCC2 (sequencing and deletion/duplication); MITF (sequencing only); EPCAM and GREM1 (deletion/duplication only). DNA and RNA analyses performed for * genes. The report date is August 25, 2019.   09/2019 - 09/2029 Anti-estrogen oral therapy   Tamoxifen     CHIEF COMPLIANT: Follow-up for breast cancer on Tamoxifen  INTERVAL HISTORY: Sandra Sayavongis a 51y.o. with above-mentioned history of breast cancer who underwent a lumpectomy and completed radiation therapy. She presents to the clinic today for a follow-up. States that she have been having problems with her stomach the last 3 months. She just got out the hospital. She has some hot flashes but overall she is tolerating the tamoxifen.   ALLERGIES:  is allergic to codeine.  MEDICATIONS:  Current Outpatient Medications  Medication Sig Dispense Refill   amLODipine (NORVASC) 10 MG tablet Take 1 tablet (10 mg total) by mouth daily.     atorvastatin (LIPITOR) 80 MG tablet Take 1 tablet (  80 mg total) by mouth daily.     carvedilol (COREG) 25 MG tablet Take 25 mg by mouth 2 (two) times daily with a meal.     famotidine (PEPCID) 20 MG tablet TAKE 1 TABLET BY MOUTH ONCE DAILY IN THE MORNING     tamoxifen  (NOLVADEX) 10 MG tablet Take 1 tablet by mouth twice daily 60 tablet 0   venlafaxine XR (EFFEXOR-XR) 75 MG 24 hr capsule TAKE 1 CAPSULE BY MOUTH ONCE DAILY WITH BREAKFAST 90 capsule 0   No current facility-administered medications for this visit.    PHYSICAL EXAMINATION: ECOG PERFORMANCE STATUS: 1 - Symptomatic but completely ambulatory  Vitals:   05/21/22 1607  BP: (!) 149/62  Pulse: 67  Resp: 17  Temp: 97.7 F (36.5 C)  SpO2: 100%   Filed Weights   05/21/22 1607  Weight: 274 lb (124.3 kg)    BREAST: No palpable masses or nodules in either right or left breasts. No palpable axillary supraclavicular or infraclavicular adenopathy no breast tenderness or nipple discharge. (exam performed in the presence of a chaperone)  LABORATORY DATA:  I have reviewed the data as listed    Latest Ref Rng & Units 12/16/2021    5:04 PM 06/29/2019    9:07 AM  CMP  Glucose 70 - 99 mg/dL 165  124   BUN 6 - 20 mg/dL 10  9   Creatinine 0.44 - 1.00 mg/dL 1.20  1.53   Sodium 135 - 145 mmol/L 140  135   Potassium 3.5 - 5.1 mmol/L 3.3  3.4   Chloride 98 - 111 mmol/L 106  101   CO2 22 - 32 mmol/L 26  24   Calcium 8.9 - 10.3 mg/dL 9.6  9.8   Total Protein 6.5 - 8.1 g/dL 6.8    Total Bilirubin 0.3 - 1.2 mg/dL 0.5    Alkaline Phos 38 - 126 U/L 55    AST 15 - 41 U/L 17    ALT 0 - 44 U/L 17      Lab Results  Component Value Date   WBC 4.7 12/16/2021   HGB 12.7 12/16/2021   HCT 40.0 12/16/2021   MCV 91.3 12/16/2021   PLT 230 12/16/2021    ASSESSMENT & PLAN:  Malignant neoplasm of upper-outer quadrant of right breast in female, estrogen receptor positive ([place]) 06/23/2019:Routine screening mammogram detected an 0.8cm mass in the right breast at the 9:00 position 1 cm from the nipple. 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  T1CN0 stage Ia clinical stage History of rheumatoid arthritis and lupus and hypertension   Treatment plan: 1. Breast conserving  surgery:Right lumpectomy (Tsuei): invasive lobular carcinoma with LCIS, 1.4cm, grade 2, clear margins, and isolated tumor cells present in 1 of 2 lymph nodes. 2. Oncotype DX testing: Recurrence score 21, recommended chemotherapy but patient refused because of small benefit 3. Adjuvant radiation therapy 08/10/2019-09/24/2019 4. Adjuvant antiestrogen therapy with tamoxifen to start 10/11/2019 ---------------------------------------------------------------------------------------------------------------- Treatment plan: Adjuvant antiestrogen therapy with tamoxifen 20 mg daily x10 years  Tamoxifen toxicities: Hot flashes moderate in severity: Takes Effexor which helps her somewhat.  Breast cancer surveillance: 1.  Breast exam 05/21/2022: Benign 2. Mammogram: 01/15/2021 [facility].  Benign breast density category B    Return to clinic in 1 year for follow-up      No orders of the defined types were placed in this encounter.  The patient has a good understanding of the overall plan. she  agrees with it. she will call with any problems that may develop before the next visit here. Total time spent: 30 mins including face to face time and time spent for planning, charting and co-ordination of care   Harriette Ohara, MD 05/21/22    I Gardiner Coins am scribing for Dr. Lindi Adie  I have reviewed the above documentation for accuracy and completeness, and I agree with the above.

## 2022-05-22 ENCOUNTER — Telehealth: Payer: Self-pay | Admitting: Hematology and Oncology

## 2022-05-22 NOTE — Telephone Encounter (Signed)
Scheduled appointment per 7/11 los. Patient is aware.

## 2022-05-24 ENCOUNTER — Other Ambulatory Visit: Payer: Self-pay | Admitting: Hematology and Oncology

## 2022-05-24 DIAGNOSIS — Z170 Estrogen receptor positive status [ER+]: Secondary | ICD-10-CM

## 2022-09-11 DEATH — deceased

## 2023-05-22 ENCOUNTER — Ambulatory Visit: Admitting: Hematology and Oncology

## 2023-05-22 NOTE — Assessment & Plan Note (Deleted)
06/23/2019:Routine screening mammogram detected an 0.8cm mass in the right breast at the 9:00 position 1 cm from the nipple. 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  T1CN0 stage Ia clinical stage History of rheumatoid arthritis and lupus and hypertension   Treatment plan: 1. Breast conserving surgery:Right lumpectomy (Tsuei): invasive lobular carcinoma with LCIS, 1.4cm, grade 2, clear margins, and isolated tumor cells present in 1 of 2 lymph nodes. 2. Oncotype DX testing: Recurrence score 21, recommended chemotherapy but patient refused because of small benefit 3. Adjuvant radiation therapy 08/10/2019-09/24/2019 4. Adjuvant antiestrogen therapy with tamoxifen to start 10/11/2019 ---------------------------------------------------------------------------------------------------------------- Treatment plan: Adjuvant antiestrogen therapy with tamoxifen 20 mg daily x10 years   Tamoxifen toxicities: Hot flashes moderate in severity: Takes Effexor which helps her somewhat.   Breast cancer surveillance: 1.  Breast exam 05/22/2023: Benign 2. Mammogram: 01/15/2021 [facility].  Benign breast density category B     Return to clinic in 1 year for follow-up
